# Patient Record
Sex: Female | Born: 1990 | Race: White | Hispanic: No | Marital: Single | Smoking: Current every day smoker
Health system: Southern US, Community
[De-identification: ages and names within clinical notes are randomized; demographics above are authoritative.]

## PROBLEM LIST (undated history)

## (undated) ENCOUNTER — Inpatient Hospital Stay: Payer: Self-pay

## (undated) DIAGNOSIS — F32A Depression, unspecified: Secondary | ICD-10-CM

## (undated) DIAGNOSIS — F419 Anxiety disorder, unspecified: Secondary | ICD-10-CM

## (undated) DIAGNOSIS — F329 Major depressive disorder, single episode, unspecified: Secondary | ICD-10-CM

## (undated) HISTORY — PX: TONSILLECTOMY: SUR1361

---
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
## 2012-01-26 ENCOUNTER — Ambulatory Visit: Payer: Self-pay | Admitting: Pain Medicine

## 2012-09-01 ENCOUNTER — Ambulatory Visit: Payer: Self-pay | Admitting: Neurological Surgery

## 2012-09-18 ENCOUNTER — Other Ambulatory Visit: Payer: Self-pay | Admitting: Neurological Surgery

## 2012-10-03 ENCOUNTER — Encounter (HOSPITAL_COMMUNITY): Payer: Self-pay | Admitting: Pharmacy Technician

## 2012-10-04 ENCOUNTER — Encounter (HOSPITAL_COMMUNITY)
Admission: RE | Admit: 2012-10-04 | Discharge: 2012-10-04 | Disposition: A | Discharge status: Home / Self Care | Payer: 59 | Source: Ambulatory Visit | Attending: Neurological Surgery | Admitting: Neurological Surgery

## 2012-10-04 ENCOUNTER — Encounter (HOSPITAL_COMMUNITY): Payer: Self-pay

## 2012-10-04 HISTORY — DX: Depression, unspecified: F32.A

## 2012-10-04 HISTORY — DX: Anxiety disorder, unspecified: F41.9

## 2012-10-04 HISTORY — DX: Major depressive disorder, single episode, unspecified: F32.9

## 2012-10-04 LAB — BASIC METABOLIC PANEL
Chloride: 103 mEq/L (ref 96–112)
GFR calc Af Amer: >90 mL/min (ref >90)
GFR calc non Af Amer: >90 mL/min (ref >90)
Potassium: 3.7 mEq/L (ref 3.5–5.1)
Sodium: 138 mEq/L (ref 135–145)

## 2012-10-04 LAB — CBC WITH DIFFERENTIAL/PLATELET
Basophils Absolute: 0 10*3/uL (ref 0.0–0.1)
Basophils Relative: 1 % (ref 0–1)
HCT: 41.1 % (ref 36.0–46.0)
Hemoglobin: 14.4 g/dL (ref 12.0–15.0)
Lymphocytes Relative: 30 % (ref 12–46)
Monocytes Absolute: 0.5 10*3/uL (ref 0.1–1.0)
Monocytes Relative: 8 % (ref 3–12)
Neutro Abs: 3.5 10*3/uL (ref 1.7–7.7)
Neutrophils Relative %: 57 % (ref 43–77)
WBC: 6.2 10*3/uL (ref 4.0–10.5)

## 2012-10-04 LAB — SURGICAL PCR SCREEN
MRSA, PCR: POSITIVE — AB
Staphylococcus aureus: POSITIVE — AB

## 2012-10-04 LAB — PROTIME-INR: INR: 1.01 (ref 0.00–1.49)

## 2012-10-04 LAB — HCG, SERUM, QUALITATIVE: Preg, Serum: NEGATIVE

## 2012-10-04 NOTE — Pre-Procedure Instructions (Signed)
Diana Richard  10/04/2012   Your procedure is scheduled on:  Thursday, March 27th  Report to [facility] at 0800 AM.  Call this number if you have problems the morning of surgery: [phone]   Remember:   Do not eat food or drink liquids after midnight.    Take these medicines the morning of surgery with A SIP OF WATER: Vicodin if needed   Do not wear jewelry, make-up or nail polish.  Do not wear lotions, powders, or perfumes,deodorant.  Do not shave 48 hours prior to surgery.  Do not bring valuables to the hospital.  Contacts, dentures or bridgework may not be worn into surgery.  Leave suitcase in the car. After surgery it may be brought to your room.  For patients admitted to the hospital, checkout time is 11:00 AM the day of discharge.   Patients discharged the day of surgery will not be allowed to drive home.    Special Instructions: Shower using CHG 2 nights before surgery and the night before surgery.  If you shower the day of surgery use CHG.  Use special wash - you have one bottle of CHG for all showers.  You should use approximately 1/3 of the bottle for each shower.   Please read over the following fact sheets that you were given: Pain Booklet, Coughing and Deep Breathing, MRSA Information and Surgical Site Infection Prevention

## 2012-10-04 NOTE — Progress Notes (Signed)
Does not have a primary physician No previous cardiac testing

## 2012-10-05 NOTE — Progress Notes (Signed)
Anesthesia chart review: Patient is a 22 year old female scheduled for left L4-5 microdiscectomy by Dr. Yetta Barre on 10/11/2012. History includes smoking, anxiety, depression, tonsillectomy. BMI is 23.85.  She does not have a PCP and denied previous cardiac testing.  EKG on 10/04/12 showed NSR with sinus arrhythmia, T wave abnormality, consider anterior ischemia. Currently there no comparison EKGs available. No CV symptoms documented from her PAT visit.  Preoperative labs and chest x-ray noted.  Patient is 22 years old without known cardiac, HTN, DM history.  She will be evaluated on the day of surgery.  If asymptomatic from a CV standpoint then would anticipate she could proceed as planned. Anesthesiologist Dr. Noreene Larsson agrees with this plan.  Velna Ochs [facility]/Anesthesiology Phone [phone] 10/05/2012 11:03 AM

## 2012-10-10 MED ORDER — CEFAZOLIN SODIUM-DEXTROSE 2-3 GM-% IV SOLR
2.0000 g | INTRAVENOUS | Status: AC
  Administered 2012-10-11: 2 g via INTRAVENOUS
  Filled 2012-10-10: qty 50

## 2012-10-10 NOTE — Progress Notes (Signed)
1635   Reached patient and informed her of time change.  Instructed her to be here at 0530 AM  DA

## 2012-10-11 ENCOUNTER — Encounter (HOSPITAL_COMMUNITY): Payer: Self-pay | Admitting: *Deleted

## 2012-10-11 ENCOUNTER — Ambulatory Visit (HOSPITAL_COMMUNITY)
Admission: RE | Admit: 2012-10-11 | Discharge: 2012-10-11 | Disposition: A | Discharge status: Home / Self Care | Payer: 59 | Source: Ambulatory Visit | Attending: Neurological Surgery | Admitting: Neurological Surgery

## 2012-10-11 ENCOUNTER — Ambulatory Visit (HOSPITAL_COMMUNITY): Payer: 59 | Admitting: Anesthesiology

## 2012-10-11 ENCOUNTER — Encounter (HOSPITAL_COMMUNITY): Payer: Self-pay | Admitting: Vascular Surgery

## 2012-10-11 ENCOUNTER — Encounter (HOSPITAL_COMMUNITY)
Admission: RE | Disposition: A | Discharge status: Home / Self Care | Payer: Self-pay | Source: Ambulatory Visit | Attending: Neurological Surgery

## 2012-10-11 DIAGNOSIS — Z01810 Encounter for preprocedural cardiovascular examination: Secondary | ICD-10-CM | POA: Insufficient documentation

## 2012-10-11 DIAGNOSIS — Z01818 Encounter for other preprocedural examination: Secondary | ICD-10-CM | POA: Insufficient documentation

## 2012-10-11 DIAGNOSIS — F17200 Nicotine dependence, unspecified, uncomplicated: Secondary | ICD-10-CM | POA: Insufficient documentation

## 2012-10-11 DIAGNOSIS — Z9889 Other specified postprocedural states: Secondary | ICD-10-CM

## 2012-10-11 DIAGNOSIS — M5126 Other intervertebral disc displacement, lumbar region: Secondary | ICD-10-CM | POA: Insufficient documentation

## 2012-10-11 DIAGNOSIS — Z01812 Encounter for preprocedural laboratory examination: Secondary | ICD-10-CM | POA: Insufficient documentation

## 2012-10-11 HISTORY — PX: LUMBAR LAMINECTOMY/DECOMPRESSION MICRODISCECTOMY: SHX5026

## 2012-10-11 SURGERY — LUMBAR LAMINECTOMY/DECOMPRESSION MICRODISCECTOMY 1 LEVEL
Anesthesia: General | Site: Back | Laterality: Left | Wound class: Clean

## 2012-10-11 MED ORDER — DEXAMETHASONE 4 MG PO TABS: 4.0000 mg | ORAL_TABLET | Freq: Four times a day (QID) | ORAL | Status: DC

## 2012-10-11 MED ORDER — CEFAZOLIN SODIUM 1-5 GM-% IV SOLN
1.0000 g | Freq: Three times a day (TID) | INTRAVENOUS | Status: DC
  Filled 2012-10-11: qty 50

## 2012-10-11 MED ORDER — HEMOSTATIC AGENTS (NO CHARGE) OPTIME
TOPICAL | Status: DC | PRN
  Administered 2012-10-11: 1 via TOPICAL

## 2012-10-11 MED ORDER — LIDOCAINE HCL (CARDIAC) 20 MG/ML IV SOLN
INTRAVENOUS | Status: DC | PRN
  Administered 2012-10-11: 70 mg via INTRAVENOUS

## 2012-10-11 MED ORDER — MENTHOL 3 MG MT LOZG: 1.0000 | LOZENGE | OROMUCOSAL | Status: DC | PRN

## 2012-10-11 MED ORDER — PHENOL 1.4 % MT LIQD: 1.0000 | OROMUCOSAL | Status: DC | PRN

## 2012-10-11 MED ORDER — ONDANSETRON HCL 4 MG/2ML IJ SOLN: 4.0000 mg | INTRAMUSCULAR | Status: DC | PRN

## 2012-10-11 MED ORDER — SODIUM CHLORIDE 0.9 % IJ SOLN
3.0000 mL | Freq: Two times a day (BID) | INTRAMUSCULAR | Status: DC
  Administered 2012-10-11: 3 mL via INTRAVENOUS

## 2012-10-11 MED ORDER — FENTANYL CITRATE 0.05 MG/ML IJ SOLN
INTRAMUSCULAR | Status: AC
  Filled 2012-10-11: qty 2

## 2012-10-11 MED ORDER — GLYCOPYRROLATE 0.2 MG/ML IJ SOLN
INTRAMUSCULAR | Status: DC | PRN
  Administered 2012-10-11: 0.4 mg via INTRAVENOUS

## 2012-10-11 MED ORDER — SODIUM CHLORIDE 0.9 % IV SOLN
INTRAVENOUS | Status: AC
  Filled 2012-10-11: qty 500

## 2012-10-11 MED ORDER — HYDROMORPHONE HCL PF 1 MG/ML IJ SOLN: 0.2500 mg | INTRAMUSCULAR | Status: DC | PRN

## 2012-10-11 MED ORDER — ACETAMINOPHEN 10 MG/ML IV SOLN: 1000.0000 mg | Freq: Four times a day (QID) | INTRAVENOUS | Status: DC

## 2012-10-11 MED ORDER — MIDAZOLAM HCL 5 MG/5ML IJ SOLN
INTRAMUSCULAR | Status: DC | PRN
  Administered 2012-10-11: 2 mg via INTRAVENOUS

## 2012-10-11 MED ORDER — METHYLPREDNISOLONE ACETATE 80 MG/ML IJ SUSP
INTRAMUSCULAR | Status: DC | PRN
  Administered 2012-10-11: 80 mg

## 2012-10-11 MED ORDER — PROPOFOL 10 MG/ML IV BOLUS
INTRAVENOUS | Status: DC | PRN
  Administered 2012-10-11: 170 mg via INTRAVENOUS

## 2012-10-11 MED ORDER — THROMBIN 5000 UNITS EX SOLR
CUTANEOUS | Status: DC | PRN
  Administered 2012-10-11 (×2): 5000 [IU] via TOPICAL

## 2012-10-11 MED ORDER — ACETAMINOPHEN 325 MG PO TABS: 650.0000 mg | ORAL_TABLET | ORAL | Status: DC | PRN

## 2012-10-11 MED ORDER — POTASSIUM CHLORIDE IN NACL 20-0.9 MEQ/L-% IV SOLN
INTRAVENOUS | Status: DC
  Filled 2012-10-11 (×2): qty 1000

## 2012-10-11 MED ORDER — BACITRACIN 50000 UNITS IM SOLR
INTRAMUSCULAR | Status: AC
  Filled 2012-10-11: qty 1

## 2012-10-11 MED ORDER — DEXAMETHASONE SODIUM PHOSPHATE 4 MG/ML IJ SOLN: 4.0000 mg | Freq: Four times a day (QID) | INTRAMUSCULAR | Status: DC

## 2012-10-11 MED ORDER — MORPHINE SULFATE 2 MG/ML IJ SOLN: 1.0000 mg | INTRAMUSCULAR | Status: DC | PRN

## 2012-10-11 MED ORDER — SODIUM CHLORIDE 0.9 % IJ SOLN: 3.0000 mL | INTRAMUSCULAR | Status: DC | PRN

## 2012-10-11 MED ORDER — BUPIVACAINE HCL (PF) 0.25 % IJ SOLN
INTRAMUSCULAR | Status: DC | PRN
  Administered 2012-10-11: 16 mL

## 2012-10-11 MED ORDER — ONDANSETRON HCL 4 MG/2ML IJ SOLN
INTRAMUSCULAR | Status: DC | PRN
  Administered 2012-10-11: 4 mg via INTRAVENOUS

## 2012-10-11 MED ORDER — LIDOCAINE HCL 4 % MT SOLN
OROMUCOSAL | Status: DC | PRN
  Administered 2012-10-11: 4 mL via TOPICAL

## 2012-10-11 MED ORDER — FENTANYL CITRATE 0.05 MG/ML IJ SOLN
INTRAMUSCULAR | Status: DC | PRN
  Administered 2012-10-11: 100 ug via INTRAVENOUS
  Administered 2012-10-11: 50 ug via INTRAVENOUS
  Administered 2012-10-11: 100 ug via INTRAVENOUS

## 2012-10-11 MED ORDER — CYCLOBENZAPRINE HCL 10 MG PO TABS: 10.0000 mg | ORAL_TABLET | Freq: Three times a day (TID) | ORAL | Status: DC | PRN

## 2012-10-11 MED ORDER — ROCURONIUM BROMIDE 100 MG/10ML IV SOLN
INTRAVENOUS | Status: DC | PRN
  Administered 2012-10-11: 40 mg via INTRAVENOUS

## 2012-10-11 MED ORDER — 0.9 % SODIUM CHLORIDE (POUR BTL) OPTIME
TOPICAL | Status: DC | PRN
  Administered 2012-10-11: 1000 mL

## 2012-10-11 MED ORDER — NEOSTIGMINE METHYLSULFATE 1 MG/ML IJ SOLN
INTRAMUSCULAR | Status: DC | PRN
  Administered 2012-10-11: 3 mg via INTRAVENOUS

## 2012-10-11 MED ORDER — OXYCODONE-ACETAMINOPHEN 5-325 MG PO TABS
1.0000 | ORAL_TABLET | ORAL | Status: DC | PRN
  Administered 2012-10-11: 1 via ORAL
  Filled 2012-10-11: qty 1

## 2012-10-11 MED ORDER — SODIUM CHLORIDE 0.9 % IR SOLN
Status: DC | PRN
  Administered 2012-10-11: 07:00:00

## 2012-10-11 MED ORDER — ACETAMINOPHEN 650 MG RE SUPP: 650.0000 mg | RECTAL | Status: DC | PRN

## 2012-10-11 MED ORDER — ACETAMINOPHEN 10 MG/ML IV SOLN
INTRAVENOUS | Status: AC
  Administered 2012-10-11: 1000 mg via INTRAVENOUS
  Filled 2012-10-11: qty 100

## 2012-10-11 MED ORDER — ARTIFICIAL TEARS OP OINT
TOPICAL_OINTMENT | OPHTHALMIC | Status: DC | PRN
  Administered 2012-10-11: 1 via OPHTHALMIC

## 2012-10-11 MED ORDER — LACTATED RINGERS IV SOLN
INTRAVENOUS | Status: DC | PRN
  Administered 2012-10-11 (×2): via INTRAVENOUS

## 2012-10-11 MED ORDER — DEXTROSE 5 % IV SOLN
INTRAVENOUS | Status: DC | PRN
  Administered 2012-10-11: 08:00:00 via INTRAVENOUS

## 2012-10-11 MED ORDER — DEXAMETHASONE SODIUM PHOSPHATE 10 MG/ML IJ SOLN
10.0000 mg | INTRAMUSCULAR | Status: AC
  Administered 2012-10-11: 10 mg via INTRAVENOUS
  Filled 2012-10-11: qty 1

## 2012-10-11 SURGICAL SUPPLY — 41 items
BAG DECANTER FOR FLEXI CONT (MISCELLANEOUS) ×2 IMPLANT
BENZOIN TINCTURE PRP APPL 2/3 (GAUZE/BANDAGES/DRESSINGS) ×2 IMPLANT
BUR MATCHSTICK NEURO 3.0 LAGG (BURR) ×2 IMPLANT
CANISTER SUCTION 2500CC (MISCELLANEOUS) ×2 IMPLANT
CLOTH BEACON ORANGE TIMEOUT ST (SAFETY) ×2 IMPLANT
CONT SPEC 4OZ CLIKSEAL STRL BL (MISCELLANEOUS) ×2 IMPLANT
DRAPE LAPAROTOMY 100X72X124 (DRAPES) ×2 IMPLANT
DRAPE MICROSCOPE ZEISS OPMI (DRAPES) ×2 IMPLANT
DRAPE POUCH INSTRU U-SHP 10X18 (DRAPES) ×2 IMPLANT
DRAPE SURG 17X23 STRL (DRAPES) ×2 IMPLANT
DRESSING TELFA 8X3 (GAUZE/BANDAGES/DRESSINGS) ×2 IMPLANT
DRSG OPSITE 4X5.5 SM (GAUZE/BANDAGES/DRESSINGS) ×2 IMPLANT
DURAPREP 26ML APPLICATOR (WOUND CARE) ×2 IMPLANT
ELECT REM PT RETURN 9FT ADLT (ELECTROSURGICAL) ×2
ELECTRODE REM PT RTRN 9FT ADLT (ELECTROSURGICAL) ×1 IMPLANT
GAUZE SPONGE 4X4 16PLY XRAY LF (GAUZE/BANDAGES/DRESSINGS) IMPLANT
GLOVE BIO SURGEON STRL SZ8 (GLOVE) ×2 IMPLANT
GLOVE INDICATOR 7.0 STRL GRN (GLOVE) ×2 IMPLANT
GLOVE OPTIFIT SS 6.5 STRL BRWN (GLOVE) ×4 IMPLANT
GOWN BRE IMP SLV AUR LG STRL (GOWN DISPOSABLE) IMPLANT
GOWN BRE IMP SLV AUR XL STRL (GOWN DISPOSABLE) ×4 IMPLANT
GOWN STRL REIN 2XL LVL4 (GOWN DISPOSABLE) IMPLANT
HEMOSTAT POWDER KIT SURGIFOAM (HEMOSTASIS) IMPLANT
KIT BASIN OR (CUSTOM PROCEDURE TRAY) ×2 IMPLANT
KIT ROOM TURNOVER OR (KITS) ×2 IMPLANT
NEEDLE HYPO 25X1 1.5 SAFETY (NEEDLE) ×2 IMPLANT
NEEDLE SPNL 20GX3.5 QUINCKE YW (NEEDLE) IMPLANT
NS IRRIG 1000ML POUR BTL (IV SOLUTION) ×2 IMPLANT
PACK LAMINECTOMY NEURO (CUSTOM PROCEDURE TRAY) ×2 IMPLANT
PAD ARMBOARD 7.5X6 YLW CONV (MISCELLANEOUS) ×6 IMPLANT
RUBBERBAND STERILE (MISCELLANEOUS) ×4 IMPLANT
SPONGE SURGIFOAM ABS GEL SZ50 (HEMOSTASIS) ×2 IMPLANT
STRIP CLOSURE SKIN 1/2X4 (GAUZE/BANDAGES/DRESSINGS) ×2 IMPLANT
SUT VIC AB 0 CT1 18XCR BRD8 (SUTURE) ×1 IMPLANT
SUT VIC AB 0 CT1 8-18 (SUTURE) ×1
SUT VIC AB 2-0 CP2 18 (SUTURE) ×2 IMPLANT
SUT VIC AB 3-0 SH 8-18 (SUTURE) ×2 IMPLANT
SYR 20ML ECCENTRIC (SYRINGE) ×2 IMPLANT
TOWEL OR 17X24 6PK STRL BLUE (TOWEL DISPOSABLE) ×2 IMPLANT
TOWEL OR 17X26 10 PK STRL BLUE (TOWEL DISPOSABLE) ×2 IMPLANT
WATER STERILE IRR 1000ML POUR (IV SOLUTION) ×2 IMPLANT

## 2012-10-11 NOTE — Anesthesia Postprocedure Evaluation (Signed)
  Anesthesia Post-op Note  Patient: Diana Richard  Procedure(s) Performed: Procedure(s) with comments: LUMBAR LAMINECTOMY/DECOMPRESSION MICRODISCECTOMY 1 LEVEL (Left) - left lumbar four-five  Patient Location: PACU  Anesthesia Type:General  Level of Consciousness: awake  Airway and Oxygen Therapy: Patient Spontanous Breathing  Post-op Pain: mild  Post-op Assessment: Post-op Vital signs reviewed  Post-op Vital Signs: Reviewed  Complications: No apparent anesthesia complications

## 2012-10-11 NOTE — Anesthesia Procedure Notes (Signed)
Procedure Name: Intubation Date/Time: 10/11/2012 7:42 AM Performed by: Tyrone Nine Pre-anesthesia Checklist: Patient identified, Timeout performed, Emergency Drugs available, Suction available and Patient being monitored Patient Re-evaluated:Patient Re-evaluated prior to inductionOxygen Delivery Method: Circle system utilized Preoxygenation: Pre-oxygenation with 100% oxygen Intubation Type: IV induction Ventilation: Mask ventilation without difficulty Laryngoscope Size: Mac and 3 Grade View: Grade I Tube type: Oral Tube size: 7.0 mm Number of attempts: 1 Airway Equipment and Method: Stylet Placement Confirmation: ETT inserted through vocal cords under direct vision,  breath sounds checked- equal and bilateral and positive ETCO2 Secured at: 22 cm Tube secured with: Tape

## 2012-10-11 NOTE — H&P (Signed)
Subjective: Patient is a 22 y.o. female admitted for L L4-5 microdiskectomy. Onset of symptoms was several months ago, gradually worsening since that time.  The pain is rated severe, and is located at the across the lower back and radiates to LLE. The pain is described as aching and occurs intermittently. The symptoms have been progressive. Symptoms are exacerbated by exercise. 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   Past Medical History  Diagnosis Date  . Depression   . Anxiety     Past Surgical History  Procedure Laterality Date  . Tonsillectomy      Prior to Admission medications   Medication Sig Start Date End Date Taking? Authorizing Provider  HYDROcodone-acetaminophen (NORCO/VICODIN) 5-325 MG per tablet Take 2 tablets by mouth every 6 (six) hours as needed for pain.   Yes Historical Provider, MD   No Known Allergies  History  Substance Use Topics  . Smoking status: Current Every Day Smoker -- 1.00 packs/day for 6 years    Types: Cigarettes  . Smokeless tobacco: Not on file  . Alcohol Use: No    History reviewed. No pertinent family history.   Review of Systems  Positive ROS: neg  All other systems have been reviewed and were otherwise negative with the exception of those mentioned in the HPI and as above.  Objective: Vital signs in last 24 hours: Temp:  [97.8 F (36.6 C)] 97.8 F (36.6 C) (03/27 0624) Pulse Rate:  [66] 66 (03/27 0624) Resp:  [20] 20 (03/27 0624) BP: (119)/(78) 119/78 mmHg (03/27 0624) SpO2:  [98 %] 98 % (03/27 0624)  General Appearance: Alert, cooperative, no distress, appears stated age Head: Normocephalic, without obvious abnormality, atraumatic Eyes: PERRL, conjunctiva/corneas clear, EOM's intact, fundi benign, both eyes      Ears: Normal TM's and external ear canals, both ears Throat: Lips, mucosa, and tongue normal; teeth and gums normal Neck: Supple, symmetrical, trachea midline, no adenopathy; thyroid: No enlargement/tenderness/nodules; no carotid  bruit or JVD Back: Symmetric, no curvature, ROM normal, no CVA tenderness Lungs: Clear to auscultation bilaterally, respirations unlabored Heart: Regular rate and rhythm, S1 and S2 normal, no murmur, rub or gallop Abdomen: Soft, non-tender, bowel sounds active all four quadrants, no masses, no organomegaly Extremities: Extremities normal, atraumatic, no cyanosis or edema Pulses: 2+ and symmetric all extremities Skin: Skin color, texture, turgor normal, no rashes or lesions  NEUROLOGIC:   Mental status: Alert and oriented x4,  no aphasia, good attention span, fund of knowledge, and memory Motor Exam - grossly normal Sensory Exam - grossly normal Reflexes: 1+ Coordination - grossly normal Gait - grossly normal Balance - grossly normal Cranial Nerves: I: smell Not tested  II: visual acuity  OS: nl    OD: nl  II: visual fields Full to confrontation  II: pupils Equal, round, reactive to light  III,VII: ptosis None  III,IV,VI: extraocular muscles  Full ROM  V: mastication Normal  V: facial light touch sensation  Normal  V,VII: corneal reflex  Present  VII: facial muscle function - upper  Normal  VII: facial muscle function - lower Normal  VIII: hearing Not tested  IX: soft palate elevation  Normal  IX,X: gag reflex Present  XI: trapezius strength  5/5  XI: sternocleidomastoid strength 5/5  XI: neck flexion strength  5/5  XII: tongue strength  Normal    Data Review Lab Results  Component Value Date   WBC 6.2 10/04/2012   HGB 14.4 10/04/2012   HCT 41.1 10/04/2012   MCV  90.9 10/04/2012   PLT 231 10/04/2012   Lab Results  Component Value Date   NA 138 10/04/2012   K 3.7 10/04/2012   CL 103 10/04/2012   CO2 22 10/04/2012   BUN 9 10/04/2012   CREATININE 0.68 10/04/2012   GLUCOSE 100* 10/04/2012   Lab Results  Component Value Date   INR 1.01 10/04/2012    Assessment/Plan: Patient admitted for microdiskectomy L4-5 Left. Patient has failed conservative therapy.  I explained the  condition and procedure to the patient and answered any questions.  Patient wishes to proceed with procedure as planned. Understands risks/ benefits and typical outcomes of procedure.   Kellyann Ordway S 10/11/2012 7:25 AM

## 2012-10-11 NOTE — Plan of Care (Signed)
Problem: Consults Goal: Diagnosis - Spinal Surgery Outcome: Completed/Met Date Met:  10/11/12 Lumbar Laminectomy (Complex)

## 2012-10-11 NOTE — Discharge Summary (Signed)
Physician Discharge Summary  Patient ID: Diana Richard MRN: [id number] DOB/AGE: [birth year] 22 y.o.  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 The patient tolerated the procedure well and was taken to the recovery room and then to the floor in stable condition. The hospital course was routine. There were no complications. The wound remained clean dry and intact. Pt had appropriate back soreness. No complaints of leg pain or new N/T/W. The patient remained afebrile with stable vital signs, and tolerated a regular diet. The patient continued to increase activities, and pain was well controlled with oral pain medications.   Consults: None  Significant Diagnostic Studies:  Results for orders placed during the hospital encounter of 10/04/12  SURGICAL PCR SCREEN      Result Value Range   MRSA, PCR POSITIVE (*) NEGATIVE   Staphylococcus aureus POSITIVE (*) NEGATIVE  HCG, SERUM, QUALITATIVE      Result Value Range   Preg, Serum NEGATIVE  NEGATIVE  BASIC METABOLIC PANEL      Result Value Range   Sodium 138  135 - 145 mEq/L   Potassium 3.7  3.5 - 5.1 mEq/L   Chloride 103  96 - 112 mEq/L   CO2 22  19 - 32 mEq/L   Glucose, Bld 100 (*) 70 - 99 mg/dL   BUN 9  6 - 23 mg/dL   Creatinine, Ser 7.84  0.50 - 1.10 mg/dL   Calcium 9.6  8.4 - 69.6 mg/dL   GFR calc non Af Amer >90  >90 mL/min   GFR calc Af Amer >90  >90 mL/min  CBC WITH DIFFERENTIAL      Result Value Range   WBC 6.2  4.0 - 10.5 K/uL   RBC 4.52  3.87 - 5.11 MIL/uL   Hemoglobin 14.4  12.0 - 15.0 g/dL   HCT 29.5  28.4 - 13.2 %   MCV 90.9  78.0 - 100.0 fL   MCH 31.9  26.0 - 34.0 pg   MCHC 35.0  30.0 - 36.0 g/dL   RDW 44.0  10.2 - 72.5 %   Platelets 231  150 - 400 K/uL   Neutrophils  Relative 57  43 - 77 %   Neutro Abs 3.5  1.7 - 7.7 K/uL   Lymphocytes Relative 30  12 - 46 %   Lymphs Abs 1.9  0.7 - 4.0 K/uL   Monocytes Relative 8  3 - 12 %   Monocytes Absolute 0.5  0.1 - 1.0 K/uL   Eosinophils Relative 5  0 - 5 %   Eosinophils Absolute 0.3  0.0 - 0.7 K/uL   Basophils Relative 1  0 - 1 %   Basophils Absolute 0.0  0.0 - 0.1 K/uL  PROTIME-INR      Result Value Range   Prothrombin Time 13.2  11.6 - 15.2 seconds   INR 1.01  0.00 - 1.49    Chest 2 View  10/04/2012  *RADIOLOGY REPORT*  Clinical Data: Preoperative lumbar spine surgery  CHEST - 2 VIEW  Comparison: None.  Findings:  Lungs clear.  Heart size and pulmonary vascularity are normal.  No adenopathy.  No bone lesions.  There is mild thoracolumbar levoscoliosis.  IMPRESSION: No edema or consolidation.   Original Report Authenticated By: Bretta Bang, M.D.  Antibiotics:  Anti-infectives   Start     Dose/Rate Route Frequency Ordered Stop   10/11/12 1545  ceFAZolin (ANCEF) IVPB 1 g/50 mL premix  Status:  Discontinued     1 g 100 mL/hr over 30 Minutes Intravenous Every 8 hours 10/11/12 1022 10/11/12 1602   10/11/12 0720  bacitracin 50,000 Units in sodium chloride irrigation 0.9 % 500 mL irrigation  Status:  Discontinued       As needed 10/11/12 0827 10/11/12 0906   10/11/12 0702  bacitracin 78469 UNITS injection  Status:  Discontinued    Comments:  RATCLIFF, ESTHER: cabinet override      10/11/12 0702 10/11/12 1602   10/11/12 0600  ceFAZolin (ANCEF) IVPB 2 g/50 mL premix     2 g 100 mL/hr over 30 Minutes Intravenous On call to O.R. 10/10/12 1449 10/11/12 0745      Discharge Exam: Blood pressure 111/70, pulse 74, temperature 98.7 F (37.1 C), temperature source Oral, resp. rate 16, last menstrual period 10/08/2012, SpO2 97.00%. Neurologic: Grossly normal incision CDI  Discharge Medications:     Medication List    TAKE these medications       HYDROcodone-acetaminophen 5-325 MG per tablet   Commonly known as:  NORCO/VICODIN  Take 2 tablets by mouth every 6 (six) hours as needed for pain.        Disposition: home   Final Dx: L L4-5 microdiskectomy      Discharge Orders   Future Orders Complete By Expires     Call MD for:  difficulty breathing, headache or visual disturbances  As directed     Call MD for:  persistant nausea and vomiting  As directed     Call MD for:  redness, tenderness, or signs of infection (pain, swelling, redness, odor or green/yellow discharge around incision site)  As directed     Call MD for:  severe uncontrolled pain  As directed     Call MD for:  temperature >100.4  As directed     Diet - low sodium heart healthy  As directed     Discharge instructions  As directed     Comments:      No driving, bending, or twisting. No lifting more than 10 lbs. May shower normally.    Increase activity slowly  As directed     Remove dressing in 48 hours  As directed        Follow-up Information   Follow up with Caci Orren S, MD. Schedule an appointment as soon as possible for a visit in 2 weeks.   Contact information:   [address] [place] [address] [phone]        Signed: Tia Alert 10/11/2012, 4:53 PM

## 2012-10-11 NOTE — OR Nursing (Signed)
Fentanyl 50 mcg/ml 2 ml  given to operative site by Dr Yetta Barre

## 2012-10-11 NOTE — Progress Notes (Signed)
Pt. Alert and oriented,follows simple instructions, denies pain. Incision area without swelling, redness or S/S of infection. Voiding adequate clear yellow urine. Moving all extremities well and vitals stable and documented. Lumbar surgery notes instructions given to patient and family member for home safety and precautions. Pt. and family stated understanding of instructions given.  

## 2012-10-11 NOTE — Preoperative (Signed)
Beta Blockers   Reason not to administer Beta Blockers:Not Applicable 

## 2012-10-11 NOTE — Transfer of Care (Signed)
Immediate Anesthesia Transfer of Care Note  Patient: Diana Richard  Procedure(s) Performed: Procedure(s) with comments: LUMBAR LAMINECTOMY/DECOMPRESSION MICRODISCECTOMY 1 LEVEL (Left) - left lumbar four-five  Patient Location: PACU  Anesthesia Type:General  Level of Consciousness: awake, alert , oriented and patient cooperative  Airway & Oxygen Therapy: Patient Spontanous Breathing and Patient connected to nasal cannula oxygen  Post-op Assessment: Report given to PACU RN and Post -op Vital signs reviewed and stable  Post vital signs: Reviewed and stable  Complications: No apparent anesthesia complications

## 2012-10-11 NOTE — Anesthesia Preprocedure Evaluation (Addendum)
Anesthesia Evaluation  Patient identified by MRN, date of birth, ID band Patient awake    Reviewed: Allergy & Precautions, H&P , NPO status   History of Anesthesia Complications (+) PONV  Airway Mallampati: II TM Distance: >3 FB Neck ROM: Full    Dental  (+) Teeth Intact and Dental Advisory Given   Pulmonary Current Smoker,  Chest X-ray 10-04-12  Findings:  Lungs clear.  Heart size and pulmonary vascularity are normal.  No adenopathy.  No bone lesions.  There is mild thoracolumbar levoscoliosis.   IMPRESSION: No edema or consolidation.    breath sounds clear to auscultation  Pulmonary exam normal       Cardiovascular Exercise Tolerance: Good negative cardio ROS  Rhythm:Regular Rate:Normal     Neuro/Psych PSYCHIATRIC DISORDERS Anxiety Anxiousnegative neurological ROS     GI/Hepatic negative GI ROS, Neg liver ROS,   Endo/Other    Renal/GU negative Renal ROS  negative genitourinary   Musculoskeletal   Abdominal   Peds  Hematology negative hematology ROS (+)   Anesthesia Other Findings Left Leg pain  Reproductive/Obstetrics                      Anesthesia Physical Anesthesia Plan  ASA: II  Anesthesia Plan: General LMA   Post-op Pain Management:    Induction: Intravenous  Airway Management Planned: Oral ETT  Additional Equipment:   Intra-op Plan:   Post-operative Plan: Extubation in OR  Informed Consent:   Dental Advisory Given  Plan Discussed with: CRNA, Anesthesiologist and Surgeon  Anesthesia Plan Comments:        Anesthesia Quick Evaluation

## 2012-10-11 NOTE — Op Note (Signed)
10/11/2012  9:09 AM  PATIENT:  Diana Richard  22 y.o. 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 Gerlene Fee  ANESTHESIA:   General  EBL: 20 ml  Total I/O In: 1150 [I.V.:1150] Out: 20 [Blood:20]  BLOOD ADMINISTERED:none  DRAINS: none   SPECIMEN:  No Specimen  INDICATION FOR PROCEDURE: This patient presented with a history of severe back and left leg pain. She tried medical management for many months without relief. 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 I recommended a left L4-5 microdiscectomy. Patient understood the risks, benefits, and alternatives and potential outcomes and wished to proceed.  PROCEDURE DETAILS: The patient was taken to the operating room and after induction of adequate generalized endotracheal anesthesia, the patient was rolled into the prone position on the Wilson frame and all pressure points were padded. The lumbar region was cleaned and then prepped with DuraPrep and draped in the usual sterile fashion. 5 cc of local anesthesia was injected and then a dorsal midline incision was made and carried down to the lumbo sacral fascia. The fascia was opened and the paraspinous musculature was taken down in a subperiosteal fashion to expose L4-5  on the left. Intraoperative x-ray confirmed my level, and then I used a combination of the high-speed drill and the Kerrison punches to perform a hemilaminectomy, medial facetectomy, and foraminotomy at L4-5 on the left. The underlying yellow ligament was opened and removed in a piecemeal fashion to expose the underlying dura and exiting nerve root. I undercut the lateral recess and dissected down until I was  medial to and distal to the pedicle. The nerve root was well decompressed. We then gently retracted the nerve root medially with a retractor, coagulated the epidural venous vasculature, and found a large subannular disc protrusion compressing the left L5 nerve root. I incised the disc space and started to release the disc with I nerve hook. A performed a thorough intradiscal discectomy with pituitary rongeurs and curettes, until I had a nice decompression of the nerve root and the midline. I then palpated with a coronary dilator along the nerve root and into the foramen to assure adequate decompression. I felt no more compression of the nerve root. I irrigated with saline solution containing bacitracin. Achieved hemostasis with bipolar cautery, lined the dura with Gelfoam, and then closed the fascia with 0 Vicryl. I closed the subcutaneous tissues with 2-0 Vicryl and the subcuticular tissues with 3-0 Vicryl. The skin was then closed with benzoin and Steri-Strips. The drapes were removed, a sterile dressing was applied. The patient was awakened from general anesthesia and transferred to the recovery room in stable condition. At the end of the procedure all sponge, needle and instrument counts were correct.   PLAN OF CARE: Admit for overnight observation  PATIENT DISPOSITION:  PACU - hemodynamically stable.   Delay start of Pharmacological VTE agent (>24hrs) due to surgical blood loss or risk of bleeding:  yes

## 2012-10-12 ENCOUNTER — Encounter (HOSPITAL_COMMUNITY): Payer: Self-pay | Admitting: Neurological Surgery

## 2013-02-16 ENCOUNTER — Ambulatory Visit: Payer: Self-pay | Admitting: Neurological Surgery

## 2013-04-29 ENCOUNTER — Other Ambulatory Visit: Payer: Self-pay | Admitting: Neurological Surgery

## 2013-05-27 ENCOUNTER — Encounter (HOSPITAL_COMMUNITY): Payer: Self-pay | Admitting: Pharmacy Technician

## 2013-05-31 ENCOUNTER — Encounter (HOSPITAL_COMMUNITY): Payer: Self-pay

## 2013-05-31 ENCOUNTER — Inpatient Hospital Stay (HOSPITAL_COMMUNITY)
Admission: RE | Admit: 2013-05-31 | Discharge: 2013-05-31 | Disposition: A | Discharge status: Home / Self Care | Payer: 59 | Source: Ambulatory Visit

## 2013-05-31 ENCOUNTER — Other Ambulatory Visit (HOSPITAL_COMMUNITY): Payer: Self-pay | Admitting: *Deleted

## 2013-05-31 NOTE — Progress Notes (Signed)
Patient unable to come for preadmit due to car trouble. Hx done over phone

## 2013-05-31 NOTE — Pre-Procedure Instructions (Signed)
Diana Richard  05/31/2013   Your procedure is scheduled on:  06/03/13  Report to [facility] short stay admitting at 530 AM.  Call this number if you have problems the morning of surgery: [phone]   Remember:   Do not eat food or drink liquids after midnight.   Take these medicines the morning of surgery with A SIP OF WATER: metoprolol, gabapentin, amlodipine, clonidine, omeprazole, sodium bicarb              STOP all herbel meds, nsaids (aleve,naproxen,advil,ibuprofen) 5 days prior to surgery   Do not wear jewelry, make-up or nail polish.  Do not wear lotions, powders, or perfumes. You may wear deodorant.  Do not shave 48 hours prior to surgery. Men may shave face and neck.  Do not bring valuables to the hospital.  [facility] is not responsible                  for any belongings or valuables.               Contacts, dentures or bridgework may not be worn into surgery.  Leave suitcase in the car. After surgery it may be brought to your room.  For patients admitted to the hospital, discharge time is determined by your                treatment team.               Patients discharged the day of surgery will not be allowed to drive  home.  Name and phone number of your driver:   Special Instructions: Shower using CHG 2 nights before surgery and the night before surgery.  If you shower the day of surgery use CHG.  Use special wash - you have one bottle of CHG for all showers.  You should use approximately 1/3 of the bottle for each shower.   Please read over the following fact sheets that you were given: Pain Booklet, Coughing and Deep Breathing, MRSA Information and Surgical Site Infection Prevention

## 2013-06-06 ENCOUNTER — Encounter (HOSPITAL_COMMUNITY)
Admission: RE | Admit: 2013-06-06 | Discharge: 2013-06-06 | Disposition: A | Discharge status: Home / Self Care | Payer: 59 | Source: Ambulatory Visit | Attending: Neurological Surgery | Admitting: Neurological Surgery

## 2013-06-06 ENCOUNTER — Encounter (HOSPITAL_COMMUNITY): Payer: Self-pay

## 2013-06-06 LAB — PROTIME-INR
INR: 0.93 (ref 0.00–1.49)
Prothrombin Time: 12.3 seconds (ref 11.6–15.2)

## 2013-06-06 LAB — CBC WITH DIFFERENTIAL/PLATELET
Hemoglobin: 15.1 g/dL — ABNORMAL HIGH (ref 12.0–15.0)
Lymphocytes Relative: 31 % (ref 12–46)
Lymphs Abs: 2 10*3/uL (ref 0.7–4.0)
Neutrophils Relative %: 53 % (ref 43–77)
Platelets: 274 10*3/uL (ref 150–400)
RBC: 4.76 MIL/uL (ref 3.87–5.11)
WBC: 6.4 10*3/uL (ref 4.0–10.5)

## 2013-06-06 LAB — BASIC METABOLIC PANEL
CO2: 21 mEq/L (ref 19–32)
Calcium: 9.7 mg/dL (ref 8.4–10.5)
GFR calc non Af Amer: >90 mL/min (ref >90)
Potassium: 3.7 mEq/L (ref 3.5–5.1)
Sodium: 138 mEq/L (ref 135–145)

## 2013-06-06 LAB — HCG, SERUM, QUALITATIVE: Preg, Serum: NEGATIVE

## 2013-06-06 MED ORDER — CEFAZOLIN SODIUM-DEXTROSE 2-3 GM-% IV SOLR
2.0000 g | INTRAVENOUS | Status: AC
  Administered 2013-06-07: 2 g via INTRAVENOUS
  Filled 2013-06-06: qty 50

## 2013-06-06 NOTE — Pre-Procedure Instructions (Signed)
Diana Richard  06/06/2013   Your procedure is scheduled on:  Friday November 21 th at 0730 AM  Report to [facility] Main  Entrance "A" at 0530 AM.  Call this number if you have problems the morning of surgery: [phone]   Remember:   Do not eat food or drink liquids after midnight.   Take these medicines the morning of surgery with A SIP OF WATER: Percocet if needed for pain   Do not wear jewelry, make-up or nail polish.  Do not wear lotions, powders, or perfumes. You may wear deodorant.  Do not shave 48 hours prior to surgery.   Do not bring valuables to the hospital.  [facility] is not responsible for any belongings or valuables.               Contacts, dentures or bridgework may not be worn into surgery.  Leave suitcase in the car. After surgery it may be brought to your room.  For patients admitted to the hospital, discharge time is determined by your  treatment team.               Patients discharged the day of surgery will not be allowed to drive home.    Special Instructions: Shower using CHG 2 nights before surgery and the night before surgery.  If you shower the day of surgery use CHG.  Use special wash - you have one bottle of CHG for all showers.  You should use approximately 1/3 of the bottle for each shower.   Please read over the following fact sheets that you were given: Pain Booklet, Coughing and Deep Breathing, MRSA Information and Surgical Site Infection Prevention

## 2013-06-07 ENCOUNTER — Encounter (HOSPITAL_COMMUNITY): Payer: Self-pay | Admitting: *Deleted

## 2013-06-07 ENCOUNTER — Ambulatory Visit (HOSPITAL_COMMUNITY): Payer: 59 | Admitting: Certified Registered"

## 2013-06-07 ENCOUNTER — Ambulatory Visit (HOSPITAL_COMMUNITY)
Admission: RE | Admit: 2013-06-07 | Discharge: 2013-06-07 | Disposition: A | Discharge status: Home / Self Care | Payer: 59 | Source: Ambulatory Visit | Attending: Neurological Surgery | Admitting: Neurological Surgery

## 2013-06-07 ENCOUNTER — Encounter (HOSPITAL_COMMUNITY): Payer: 59 | Admitting: Vascular Surgery

## 2013-06-07 ENCOUNTER — Ambulatory Visit (HOSPITAL_COMMUNITY): Payer: 59

## 2013-06-07 ENCOUNTER — Encounter (HOSPITAL_COMMUNITY)
Admission: RE | Disposition: A | Discharge status: Home / Self Care | Payer: Self-pay | Source: Ambulatory Visit | Attending: Neurological Surgery

## 2013-06-07 DIAGNOSIS — F329 Major depressive disorder, single episode, unspecified: Secondary | ICD-10-CM | POA: Insufficient documentation

## 2013-06-07 DIAGNOSIS — M5126 Other intervertebral disc displacement, lumbar region: Secondary | ICD-10-CM | POA: Insufficient documentation

## 2013-06-07 DIAGNOSIS — F3289 Other specified depressive episodes: Secondary | ICD-10-CM | POA: Insufficient documentation

## 2013-06-07 DIAGNOSIS — F17200 Nicotine dependence, unspecified, uncomplicated: Secondary | ICD-10-CM | POA: Insufficient documentation

## 2013-06-07 DIAGNOSIS — F411 Generalized anxiety disorder: Secondary | ICD-10-CM | POA: Insufficient documentation

## 2013-06-07 HISTORY — PX: LUMBAR LAMINECTOMY/DECOMPRESSION MICRODISCECTOMY: SHX5026

## 2013-06-07 SURGERY — LUMBAR LAMINECTOMY/DECOMPRESSION MICRODISCECTOMY 1 LEVEL
Anesthesia: General | Site: Back | Laterality: Right | Wound class: Clean

## 2013-06-07 MED ORDER — DEXAMETHASONE SODIUM PHOSPHATE 4 MG/ML IJ SOLN
4.0000 mg | Freq: Four times a day (QID) | INTRAMUSCULAR | Status: DC
  Administered 2013-06-07: 4 mg via INTRAVENOUS
  Filled 2013-06-07: qty 1

## 2013-06-07 MED ORDER — ONDANSETRON HCL 4 MG/2ML IJ SOLN: 4.0000 mg | INTRAMUSCULAR | Status: DC | PRN

## 2013-06-07 MED ORDER — METHOCARBAMOL 500 MG PO TABS
500.0000 mg | ORAL_TABLET | Freq: Four times a day (QID) | ORAL | Status: DC | PRN
  Administered 2013-06-07: 500 mg via ORAL
  Filled 2013-06-07: qty 1

## 2013-06-07 MED ORDER — LACTATED RINGERS IV SOLN
INTRAVENOUS | Status: DC | PRN
  Administered 2013-06-07 (×2): via INTRAVENOUS

## 2013-06-07 MED ORDER — NEOSTIGMINE METHYLSULFATE 1 MG/ML IJ SOLN
INTRAMUSCULAR | Status: DC | PRN
  Administered 2013-06-07: 3 mg via INTRAVENOUS

## 2013-06-07 MED ORDER — ACETAMINOPHEN 650 MG RE SUPP: 650.0000 mg | RECTAL | Status: DC | PRN

## 2013-06-07 MED ORDER — SODIUM CHLORIDE 0.9 % IJ SOLN
3.0000 mL | Freq: Two times a day (BID) | INTRAMUSCULAR | Status: DC
  Administered 2013-06-07: 3 mL via INTRAVENOUS

## 2013-06-07 MED ORDER — MORPHINE SULFATE 2 MG/ML IJ SOLN
1.0000 mg | INTRAMUSCULAR | Status: DC | PRN
  Administered 2013-06-07: 2 mg via INTRAVENOUS
  Filled 2013-06-07: qty 1

## 2013-06-07 MED ORDER — GLYCOPYRROLATE 0.2 MG/ML IJ SOLN
INTRAMUSCULAR | Status: DC | PRN
  Administered 2013-06-07: 0.4 mg via INTRAVENOUS

## 2013-06-07 MED ORDER — POTASSIUM CHLORIDE IN NACL 20-0.9 MEQ/L-% IV SOLN
INTRAVENOUS | Status: DC
  Filled 2013-06-07: qty 1000

## 2013-06-07 MED ORDER — THROMBIN 5000 UNITS EX SOLR
OROMUCOSAL | Status: DC | PRN
  Administered 2013-06-07: 08:00:00 via TOPICAL

## 2013-06-07 MED ORDER — MUPIROCIN CALCIUM 2 % EX CREA
TOPICAL_CREAM | Freq: Two times a day (BID) | CUTANEOUS | Status: DC
  Filled 2013-06-07: qty 15

## 2013-06-07 MED ORDER — LIDOCAINE HCL (CARDIAC) 20 MG/ML IV SOLN
INTRAVENOUS | Status: DC | PRN
  Administered 2013-06-07: 80 mg via INTRAVENOUS

## 2013-06-07 MED ORDER — DEXAMETHASONE 4 MG PO TABS: 4.0000 mg | ORAL_TABLET | Freq: Four times a day (QID) | ORAL | Status: DC

## 2013-06-07 MED ORDER — FENTANYL CITRATE 0.05 MG/ML IJ SOLN
INTRAMUSCULAR | Status: DC | PRN
  Administered 2013-06-07: 100 ug via INTRAVENOUS

## 2013-06-07 MED ORDER — OXYCODONE-ACETAMINOPHEN 5-325 MG PO TABS
1.0000 | ORAL_TABLET | ORAL | Status: DC | PRN
  Administered 2013-06-07: 2 via ORAL
  Filled 2013-06-07: qty 2

## 2013-06-07 MED ORDER — SODIUM CHLORIDE 0.9 % IJ SOLN: 3.0000 mL | INTRAMUSCULAR | Status: DC | PRN

## 2013-06-07 MED ORDER — HYDROMORPHONE HCL PF 1 MG/ML IJ SOLN: 0.2500 mg | INTRAMUSCULAR | Status: DC | PRN

## 2013-06-07 MED ORDER — METHOCARBAMOL 100 MG/ML IJ SOLN
500.0000 mg | Freq: Four times a day (QID) | INTRAVENOUS | Status: DC | PRN
  Filled 2013-06-07: qty 5

## 2013-06-07 MED ORDER — ROCURONIUM BROMIDE 100 MG/10ML IV SOLN
INTRAVENOUS | Status: DC | PRN
  Administered 2013-06-07: 35 mg via INTRAVENOUS

## 2013-06-07 MED ORDER — PHENOL 1.4 % MT LIQD: 1.0000 | OROMUCOSAL | Status: DC | PRN

## 2013-06-07 MED ORDER — ACETAMINOPHEN 325 MG PO TABS: 650.0000 mg | ORAL_TABLET | ORAL | Status: DC | PRN

## 2013-06-07 MED ORDER — THROMBIN 5000 UNITS EX SOLR
CUTANEOUS | Status: DC | PRN
  Administered 2013-06-07 (×2): 5000 [IU] via TOPICAL

## 2013-06-07 MED ORDER — ONDANSETRON HCL 4 MG/2ML IJ SOLN: 4.0000 mg | Freq: Once | INTRAMUSCULAR | Status: DC | PRN

## 2013-06-07 MED ORDER — 0.9 % SODIUM CHLORIDE (POUR BTL) OPTIME
TOPICAL | Status: DC | PRN
  Administered 2013-06-07: 1000 mL

## 2013-06-07 MED ORDER — CEFAZOLIN SODIUM 1-5 GM-% IV SOLN
1.0000 g | Freq: Three times a day (TID) | INTRAVENOUS | Status: DC
  Filled 2013-06-07: qty 50

## 2013-06-07 MED ORDER — SODIUM CHLORIDE 0.9 % IR SOLN
Status: DC | PRN
  Administered 2013-06-07: 08:00:00

## 2013-06-07 MED ORDER — ONDANSETRON HCL 4 MG/2ML IJ SOLN
INTRAMUSCULAR | Status: DC | PRN
  Administered 2013-06-07: 4 mg via INTRAVENOUS

## 2013-06-07 MED ORDER — MENTHOL 3 MG MT LOZG: 1.0000 | LOZENGE | OROMUCOSAL | Status: DC | PRN

## 2013-06-07 MED ORDER — LIDOCAINE HCL 4 % MT SOLN
OROMUCOSAL | Status: DC | PRN
  Administered 2013-06-07: 5 mL via TOPICAL

## 2013-06-07 MED ORDER — MUPIROCIN 2 % EX OINT
TOPICAL_OINTMENT | CUTANEOUS | Status: AC
  Administered 2013-06-07: 1
  Filled 2013-06-07: qty 22

## 2013-06-07 MED ORDER — KETOROLAC TROMETHAMINE 30 MG/ML IJ SOLN
INTRAMUSCULAR | Status: DC | PRN
  Administered 2013-06-07: 30 mg via INTRAVENOUS

## 2013-06-07 MED ORDER — HEMOSTATIC AGENTS (NO CHARGE) OPTIME
TOPICAL | Status: DC | PRN
  Administered 2013-06-07: 1 via TOPICAL

## 2013-06-07 MED ORDER — MIDAZOLAM HCL 5 MG/5ML IJ SOLN
INTRAMUSCULAR | Status: DC | PRN
  Administered 2013-06-07: 2 mg via INTRAVENOUS

## 2013-06-07 MED ORDER — PROPOFOL 10 MG/ML IV BOLUS
INTRAVENOUS | Status: DC | PRN
  Administered 2013-06-07: 200 mg via INTRAVENOUS

## 2013-06-07 MED ORDER — BUPIVACAINE HCL (PF) 0.25 % IJ SOLN
INTRAMUSCULAR | Status: DC | PRN
  Administered 2013-06-07: 13 mL

## 2013-06-07 MED ORDER — DEXAMETHASONE SODIUM PHOSPHATE 10 MG/ML IJ SOLN
10.0000 mg | INTRAMUSCULAR | Status: AC
  Administered 2013-06-07: 10 mg via INTRAVENOUS
  Filled 2013-06-07: qty 1

## 2013-06-07 MED ORDER — ARTIFICIAL TEARS OP OINT
TOPICAL_OINTMENT | OPHTHALMIC | Status: DC | PRN
  Administered 2013-06-07: 1 via OPHTHALMIC

## 2013-06-07 SURGICAL SUPPLY — 44 items
BAG DECANTER FOR FLEXI CONT (MISCELLANEOUS) ×2 IMPLANT
BENZOIN TINCTURE PRP APPL 2/3 (GAUZE/BANDAGES/DRESSINGS) ×2 IMPLANT
BUR MATCHSTICK NEURO 3.0 LAGG (BURR) ×2 IMPLANT
CANISTER SUCT 3000ML (MISCELLANEOUS) ×2 IMPLANT
CONT SPEC 4OZ CLIKSEAL STRL BL (MISCELLANEOUS) ×2 IMPLANT
DRAPE LAPAROTOMY 100X72X124 (DRAPES) ×2 IMPLANT
DRAPE MICROSCOPE LEICA (MISCELLANEOUS) ×2 IMPLANT
DRAPE MICROSCOPE ZEISS OPMI (DRAPES) IMPLANT
DRAPE POUCH INSTRU U-SHP 10X18 (DRAPES) ×2 IMPLANT
DRAPE SURG 17X23 STRL (DRAPES) ×2 IMPLANT
DRESSING TELFA 8X3 (GAUZE/BANDAGES/DRESSINGS) ×2 IMPLANT
DRSG OPSITE 4X5.5 SM (GAUZE/BANDAGES/DRESSINGS) ×2 IMPLANT
DURAPREP 26ML APPLICATOR (WOUND CARE) ×2 IMPLANT
ELECT REM PT RETURN 9FT ADLT (ELECTROSURGICAL) ×2
ELECTRODE REM PT RTRN 9FT ADLT (ELECTROSURGICAL) ×1 IMPLANT
GAUZE SPONGE 4X4 16PLY XRAY LF (GAUZE/BANDAGES/DRESSINGS) IMPLANT
GLOVE BIO SURGEON STRL SZ8 (GLOVE) ×2 IMPLANT
GLOVE BIOGEL PI IND STRL 7.0 (GLOVE) ×2 IMPLANT
GLOVE BIOGEL PI INDICATOR 7.0 (GLOVE) ×2
GLOVE ECLIPSE 8.5 STRL (GLOVE) ×2 IMPLANT
GLOVE SS BIOGEL STRL SZ 6.5 (GLOVE) ×2 IMPLANT
GLOVE SUPERSENSE BIOGEL SZ 6.5 (GLOVE) ×2
GOWN BRE IMP SLV AUR LG STRL (GOWN DISPOSABLE) ×2 IMPLANT
GOWN BRE IMP SLV AUR XL STRL (GOWN DISPOSABLE) ×4 IMPLANT
GOWN STRL REIN 2XL LVL4 (GOWN DISPOSABLE) IMPLANT
HEMOSTAT POWDER KIT SURGIFOAM (HEMOSTASIS) IMPLANT
KIT BASIN OR (CUSTOM PROCEDURE TRAY) ×2 IMPLANT
KIT ROOM TURNOVER OR (KITS) ×2 IMPLANT
NEEDLE HYPO 25X1 1.5 SAFETY (NEEDLE) ×2 IMPLANT
NEEDLE SPNL 20GX3.5 QUINCKE YW (NEEDLE) IMPLANT
NS IRRIG 1000ML POUR BTL (IV SOLUTION) ×2 IMPLANT
PACK LAMINECTOMY NEURO (CUSTOM PROCEDURE TRAY) ×2 IMPLANT
PAD ARMBOARD 7.5X6 YLW CONV (MISCELLANEOUS) ×6 IMPLANT
RUBBERBAND STERILE (MISCELLANEOUS) ×4 IMPLANT
SPONGE SURGIFOAM ABS GEL SZ50 (HEMOSTASIS) ×2 IMPLANT
STRIP CLOSURE SKIN 1/2X4 (GAUZE/BANDAGES/DRESSINGS) ×2 IMPLANT
SUT VIC AB 0 CT1 18XCR BRD8 (SUTURE) ×1 IMPLANT
SUT VIC AB 0 CT1 8-18 (SUTURE) ×1
SUT VIC AB 2-0 CP2 18 (SUTURE) ×2 IMPLANT
SUT VIC AB 3-0 SH 8-18 (SUTURE) ×4 IMPLANT
SYR 20ML ECCENTRIC (SYRINGE) ×2 IMPLANT
TOWEL OR 17X24 6PK STRL BLUE (TOWEL DISPOSABLE) ×2 IMPLANT
TOWEL OR 17X26 10 PK STRL BLUE (TOWEL DISPOSABLE) ×2 IMPLANT
WATER STERILE IRR 1000ML POUR (IV SOLUTION) ×2 IMPLANT

## 2013-06-07 NOTE — H&P (Signed)
Subjective: Patient is a 22 y.o. female admitted for L5-S1 microdiscectomy. Onset of symptoms was a few months ago, gradually worsening since that time.  The pain is rated severe, and is located at the across the lower back and radiates to right leg. The pain is described as aching and occurs all day. The symptoms have been progressive. Symptoms are exacerbated by exercise. MRI or CT showed right L5-S1 herniated disc.   Past Medical History  Diagnosis Date  . Depression   . Anxiety     Past Surgical History  Procedure Laterality Date  . Tonsillectomy    . Lumbar laminectomy/decompression microdiscectomy Left 10/11/2012    Procedure: LUMBAR LAMINECTOMY/DECOMPRESSION MICRODISCECTOMY 1 LEVEL;  Surgeon: Tia Alert, MD;  Location: [facility];  Service: Neurosurgery;  Laterality: Left;  left lumbar four-five    Prior to Admission medications   Medication Sig Start Date End Date Taking? Authorizing Provider  oxyCODONE-acetaminophen (PERCOCET/ROXICET) 5-325 MG per tablet Take 1-2 tablets by mouth every 6 (six) hours as needed for severe pain.   Yes Historical Provider, MD   No Known Allergies  History  Substance Use Topics  . Smoking status: Current Every Day Smoker -- 1.00 packs/day for 6 years    Types: Cigarettes  . Smokeless tobacco: Never Used  . Alcohol Use: No    History reviewed. No pertinent family history.   Review of Systems  Positive ROS: neg  All other systems have been reviewed and were otherwise negative with the exception of those mentioned in the HPI and as above.  Objective: Vital signs in last 24 hours: Temp:  [97.1 F (36.2 C)-98.7 F (37.1 C)] 97.1 F (36.2 C) (11/21 0625) Pulse Rate:  [94-97] 94 (11/21 0625) Resp:  [20] 20 (11/21 0625) BP: (124-138)/(73-87) 138/73 mmHg (11/21 0625) SpO2:  [97 %-99 %] 99 % (11/21 0625) Weight:  [77.928 kg (171 lb 12.8 oz)] 77.928 kg (171 lb 12.8 oz) (11/20 1524)  General Appearance: Alert, cooperative, no distress,  appears stated age Head: Normocephalic, without obvious abnormality, atraumatic Eyes: PERRL, conjunctiva/corneas clear, EOM's intact    Neck: Supple, symmetrical, trachea midline Back: Symmetric, no curvature, ROM normal, no CVA tenderness Lungs:  respirations unlabored Heart: Regular rate and rhythm Abdomen: Soft, non-tender Extremities: Extremities normal, atraumatic, no cyanosis or edema Pulses: 2+ and symmetric all extremities Skin: Skin color, texture, turgor normal, no rashes or lesions  NEUROLOGIC:   Mental status: Alert and oriented x4,  no aphasia, good attention span, fund of knowledge, and memory Motor Exam - grossly normal Sensory Exam - grossly normal Reflexes: 1+ Coordination - grossly normal Gait - grossly normal Balance - grossly normal Cranial Nerves: I: smell Not tested  II: visual acuity  OS: nl    OD: nl  II: visual fields Full to confrontation  II: pupils Equal, round, reactive to light  III,VII: ptosis None  III,IV,VI: extraocular muscles  Full ROM  V: mastication Normal  V: facial light touch sensation  Normal  V,VII: corneal reflex  Present  VII: facial muscle function - upper  Normal  VII: facial muscle function - lower Normal  VIII: hearing Not tested  IX: soft palate elevation  Normal  IX,X: gag reflex Present  XI: trapezius strength  5/5  XI: sternocleidomastoid strength 5/5  XI: neck flexion strength  5/5  XII: tongue strength  Normal    Data Review Lab Results  Component Value Date   WBC 6.4 06/06/2013   HGB 15.1* 06/06/2013   HCT 43.3  06/06/2013   MCV 91.0 06/06/2013   PLT 274 06/06/2013   Lab Results  Component Value Date   NA 138 06/06/2013   K 3.7 06/06/2013   CL 103 06/06/2013   CO2 21 06/06/2013   BUN 6 06/06/2013   CREATININE 0.60 06/06/2013   GLUCOSE 75 06/06/2013   Lab Results  Component Value Date   INR 0.93 06/06/2013    Assessment/Plan: Patient admitted for right L5-S1 microdiscectomy. Patient has failed a  reasonable attempt at conservative therapy.  I explained the condition and procedure to the patient and answered any questions.  Patient wishes to proceed with procedure as planned. Understands risks/ benefits and typical outcomes of procedure.   JONES,DAVID S 06/07/2013 7:14 AM

## 2013-06-07 NOTE — Transfer of Care (Signed)
Immediate Anesthesia Transfer of Care Note  Patient: Diana Richard  Procedure(s) Performed: Procedure(s) with comments: Right Lumbar five-sacral one microdiskectomy  (Right) - Right Lumbar five-sacral one microdiskectomy   Patient Location: PACU  Anesthesia Type:General  Level of Consciousness: awake, alert  and oriented  Airway & Oxygen Therapy: Patient Spontanous Breathing and Patient connected to face mask oxygen  Post-op Assessment: Report given to PACU RN, Post -op Vital signs reviewed and stable and Patient moving all extremities X 4  Post vital signs: Reviewed and stable  Complications: No apparent anesthesia complications

## 2013-06-07 NOTE — Progress Notes (Signed)
Pt. Alert and oriented, follows simple instructions, denies pain. Incision area without swelling, redness or S/S of infection. Voiding adequate clear yellow urine. Moving all extremities well and vitals stable and documented. Patient discharged home with family. Lumbar  surgery notes instructions given to patient and family member for home safety and precautions. Pt. and family stated understanding of instructions given. Pain meds given per Pt.'s request for pain and discomfort of ride home 

## 2013-06-07 NOTE — Anesthesia Postprocedure Evaluation (Signed)
  Anesthesia Post-op Note  Patient: Diana Richard  Procedure(s) Performed: Procedure(s) with comments: Right Lumbar five-sacral one microdiskectomy  (Right) - Right Lumbar five-sacral one microdiskectomy   Patient Location: PACU  Anesthesia Type:General  Level of Consciousness: awake, oriented, sedated and patient cooperative  Airway and Oxygen Therapy: Patient Spontanous Breathing  Post-op Pain: mild  Post-op Assessment: Post-op Vital signs reviewed, Patient's Cardiovascular Status Stable, Respiratory Function Stable, Patent Airway, No signs of Nausea or vomiting and Pain level controlled  Post-op Vital Signs: stable  Complications: No apparent anesthesia complications

## 2013-06-07 NOTE — Progress Notes (Signed)
Dr.Smith notified of pt eating spaghetti and bread at 0100-ok to proceed

## 2013-06-07 NOTE — Op Note (Signed)
06/07/2013  8:55 AM  PATIENT:  Diana Richard  22 y.o. female  PRE-OPERATIVE DIAGNOSIS:  Right L5-S1 herniated nucleus pulposus  With right L5 and S1 radiculopathy  POST-OPERATIVE DIAGNOSIS:  same  PROCEDURE:  Right L5-S1 hemilaminectomy, medial facetectomy, and foraminotomies followed by microdiscectomy utilizing microscopic dissection to decompress both L5 and the S1 nerve roots  SURGEON:  Marikay Alar, MD  ASSISTANTS: None  ANESTHESIA:   General  EBL: Less than 50 ml  Total I/O In: 1000 [I.V.:1000] Out: -   BLOOD ADMINISTERED:none  DRAINS: none   SPECIMEN:  No Specimen  INDICATION FOR PROCEDURE:  This patient presented with severe right leg pain. She an MRI which showed a herniated disc with a free fragment extending superiorly compressing the L5 and S1 nerve roots. She tried medical management without relief. Recommended a microdiscectomy. She understood the risks, benefits, and alternatives and potential outcomes and wished to proceed.  PROCEDURE DETAILS: The patient was taken to the operating room and after induction of adequate generalized endotracheal anesthesia, the patient was rolled into the prone position on the Wilson frame and all pressure points were padded. The lumbar region was cleaned and then prepped with DuraPrep and draped in the usual sterile fashion. 5 cc of local anesthesia was injected and then a dorsal midline incision was made and carried down to the lumbo sacral fascia. The fascia was opened and the paraspinous musculature was taken down in a subperiosteal fashion to expose L5-S1 on the right. Intraoperative x-ray confirmed my level, and then I used a combination of the high-speed drill and the Kerrison punches to perform a hemilaminectomy, medial facetectomy, and foraminotomy at L5-S1 on the right. The underlying yellow ligament was opened and removed in a piecemeal fashion to expose the underlying dura and exiting nerve root. I undercut the lateral  recess and dissected down until I was medial to and distal to the pedicle. Also dissected more superiorly because of the superior disc fragment under the L5 nerve root. The nerve root was well decompressed. We then gently retracted the nerve root medially with a retractor, coagulated the epidural venous vasculature, and found a superior fragment that was removed with a nerve hook and a pituitary rongeur. I incised the disc space. I performed a thorough intradiscal discectomy with pituitary rongeurs and curettes, until I had a nice decompression of the nerve root and the midline. I then palpated with a coronary dilator along the nerve root and into the foramen to assure adequate decompression. I felt no more compression of the nerve root. I irrigated with saline solution containing bacitracin. Achieved hemostasis with bipolar cautery, lined the dura with Gelfoam, and then closed the fascia with 0 Vicryl. I closed the subcutaneous tissues with 2-0 Vicryl and the subcuticular tissues with 3-0 Vicryl. The skin was then closed with benzoin and Steri-Strips. The drapes were removed, a sterile dressing was applied. The patient was awakened from general anesthesia and transferred to the recovery room in stable condition. At the end of the procedure all sponge, needle and instrument counts were correct.   PLAN OF CARE: Admit for overnight observation  PATIENT DISPOSITION:  PACU - hemodynamically stable.   Delay start of Pharmacological VTE agent (>24hrs) due to surgical blood loss or risk of bleeding:  yes

## 2013-06-07 NOTE — Anesthesia Preprocedure Evaluation (Addendum)
Anesthesia Evaluation  Patient identified by MRN, date of birth, ID band Patient awake    Reviewed: Allergy & Precautions, H&P , NPO status , Patient's Chart, lab work & pertinent test results  Airway       Dental   Pulmonary Current Smoker,          Cardiovascular     Neuro/Psych PSYCHIATRIC DISORDERS Anxiety Depression    GI/Hepatic   Endo/Other    Renal/GU      Musculoskeletal   Abdominal   Peds  Hematology   Anesthesia Other Findings   Reproductive/Obstetrics                          Anesthesia Physical Anesthesia Plan  ASA: I  Anesthesia Plan: General   Post-op Pain Management:    Induction: Intravenous  Airway Management Planned: Oral ETT  Additional Equipment:   Intra-op Plan:   Post-operative Plan: Extubation in OR  Informed Consent: I have reviewed the patients History and Physical, chart, labs and discussed the procedure including the risks, benefits and alternatives for the proposed anesthesia with the patient or authorized representative who has indicated his/her understanding and acceptance.     Plan Discussed with: CRNA, Anesthesiologist and Surgeon  Anesthesia Plan Comments:         Anesthesia Quick Evaluation

## 2013-06-07 NOTE — Plan of Care (Signed)
Problem: Consults Goal: Diagnosis - Spinal Surgery Outcome: Completed/Met Date Met:  06/07/13 Microdiscectomy

## 2013-06-11 ENCOUNTER — Encounter (HOSPITAL_COMMUNITY): Payer: Self-pay | Admitting: Neurological Surgery

## 2013-12-13 IMAGING — CR DG LUMBAR SPINE 2-3V
1 series · 1 of 1 positions shown · non-contrast
Comparison: None.

CLINICAL DATA: Intraoperative localization.

LUMBAR SPINE - 2-3 VIEW

[view not recorded]
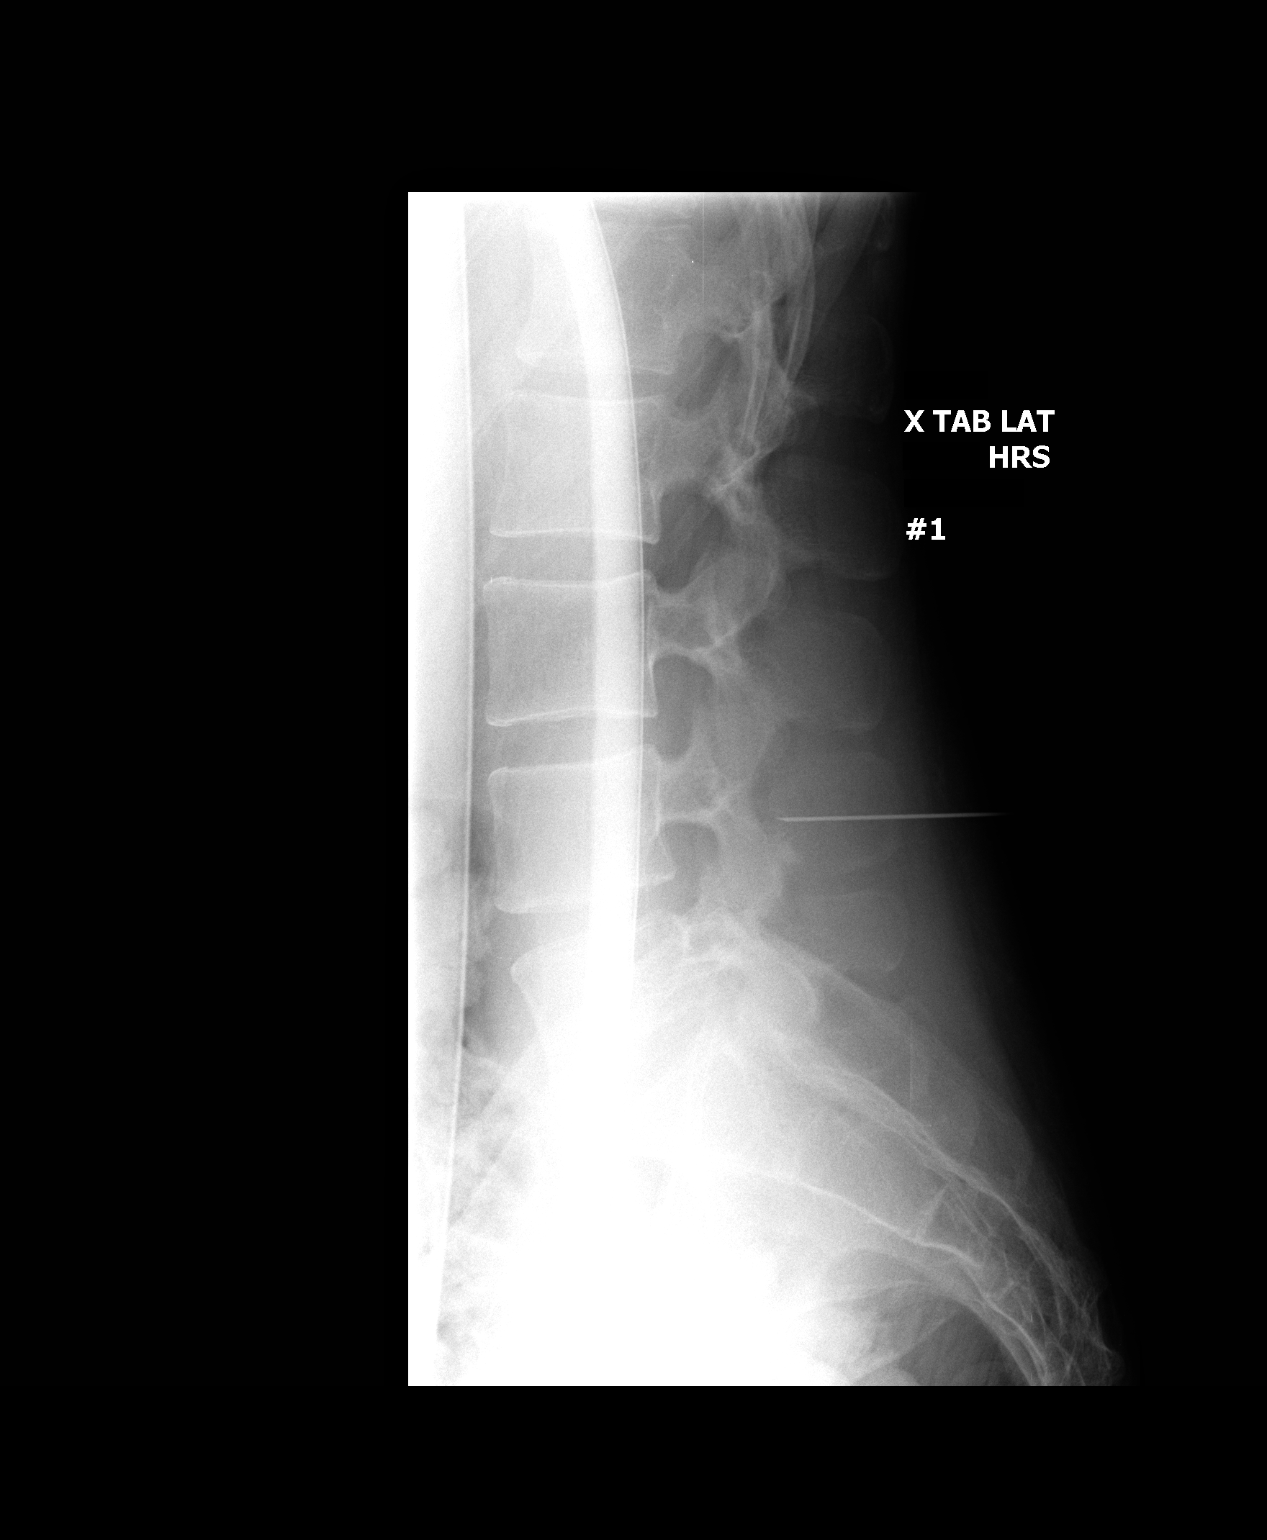

[1 of 1 positions shown; findings below may reference images not displayed]

FINDINGS: First lateral intraoperative image of the lumbar spine
demonstrates posterior needle directed at the L4 vertebral body.

Second lateral intraoperative image demonstrates posterior surgical
instruments at the L4-5 level.
IMPRESSION: Intraoperative localization as above.

## 2014-03-03 ENCOUNTER — Observation Stay: Payer: Self-pay | Admitting: Obstetrics & Gynecology

## 2014-03-08 ENCOUNTER — Observation Stay: Payer: Self-pay | Admitting: Obstetrics & Gynecology

## 2014-03-09 ENCOUNTER — Inpatient Hospital Stay: Payer: Self-pay

## 2014-03-09 LAB — CBC WITH DIFFERENTIAL/PLATELET
BASOS ABS: 0.1 10*3/uL (ref 0.0–0.1)
Basophil %: 0.4 %
EOS ABS: 0.1 10*3/uL (ref 0.0–0.7)
Eosinophil %: 0.9 %
HCT: 41.3 % (ref 35.0–47.0)
HGB: 14.1 g/dL (ref 12.0–16.0)
Lymphocyte #: 1.6 10*3/uL (ref 1.0–3.6)
Lymphocyte %: 11 %
MCH: 31.4 pg (ref 26.0–34.0)
MCHC: 34 g/dL (ref 32.0–36.0)
MCV: 92 fL (ref 80–100)
MONO ABS: 1.1 x10 3/mm — AB (ref 0.2–0.9)
Monocyte %: 7.4 %
NEUTROS PCT: 80.3 %
Neutrophil #: 12 10*3/uL — ABNORMAL HIGH (ref 1.4–6.5)
Platelet: 205 10*3/uL (ref 150–440)
RBC: 4.48 10*6/uL (ref 3.80–5.20)
RDW: 13.5 % (ref 11.5–14.5)
WBC: 14.9 10*3/uL — AB (ref 3.6–11.0)

## 2014-03-09 LAB — GC/CHLAMYDIA PROBE AMP

## 2014-03-11 LAB — HEMATOCRIT: HCT: 33.1 % — ABNORMAL LOW (ref 35.0–47.0)

## 2014-07-18 NOTE — L&D Delivery Note (Signed)
   Operative Delivery Note  Pregnancy complicated by Subutex maintanence 8mg  in am, 8 mg in pm and 4 mg at night.  At 9:13 PM a viable and healthy vigorous female "Diana Richard" was delivered via Vaginal, Vacuum Investment banker, operational(Extractor) for maternal exhaustion and deep variable decels over an intact perineum.  Presentation: vertex; Position: Occiput,, Anterior; Station: +3.  Verbal consent: obtained from patient.  Risks and benefits discussed in brief.  Risks include, but are not limited to the risks of anesthesia, bleeding, infection, damage to maternal tissues, fetal cephalhematoma.  There is also the risk of inability to effect vaginal delivery of the head, or shoulder dystocia that cannot be resolved by established maneuvers, leading to the need for emergency cesarean section.  APGAR: 8, 9; weight 6 lb 13 oz (3090 g).   Placenta status: Intact, Spontaneous.   Cord: 3 vessels with the following complications: None.  Cord pH: pending  Anesthesia: Epidural  Instruments: Kiwi flat vacuum extractor Episiotomy: None Lacerations: 2nd degree;Labial Suture Repair: 2.0 3.0 vicryl Est. Blood Loss (mL):  500  Concern for bleeding with pitocin running. 0.2mg  methergine given IM to assist in clamping down. Bleeding at the end of the case was minimal with firm fundus. Manual uterine evacuation of several large lower uterine clots.  Mom to postpartum.  Baby to Nursery.  [name], Amarylis Rovito 06/30/2015, 9:41 PM

## 2014-08-09 IMAGING — DX DG LUMBAR SPINE 1V
1 series · 1 of 1 positions shown · non-contrast
Comparison: Radiograph dated 10/11/2012

CLINICAL DATA: Lumbar disc disease.

EXAM:
LUMBAR SPINE - 1 VIEW

[lat]
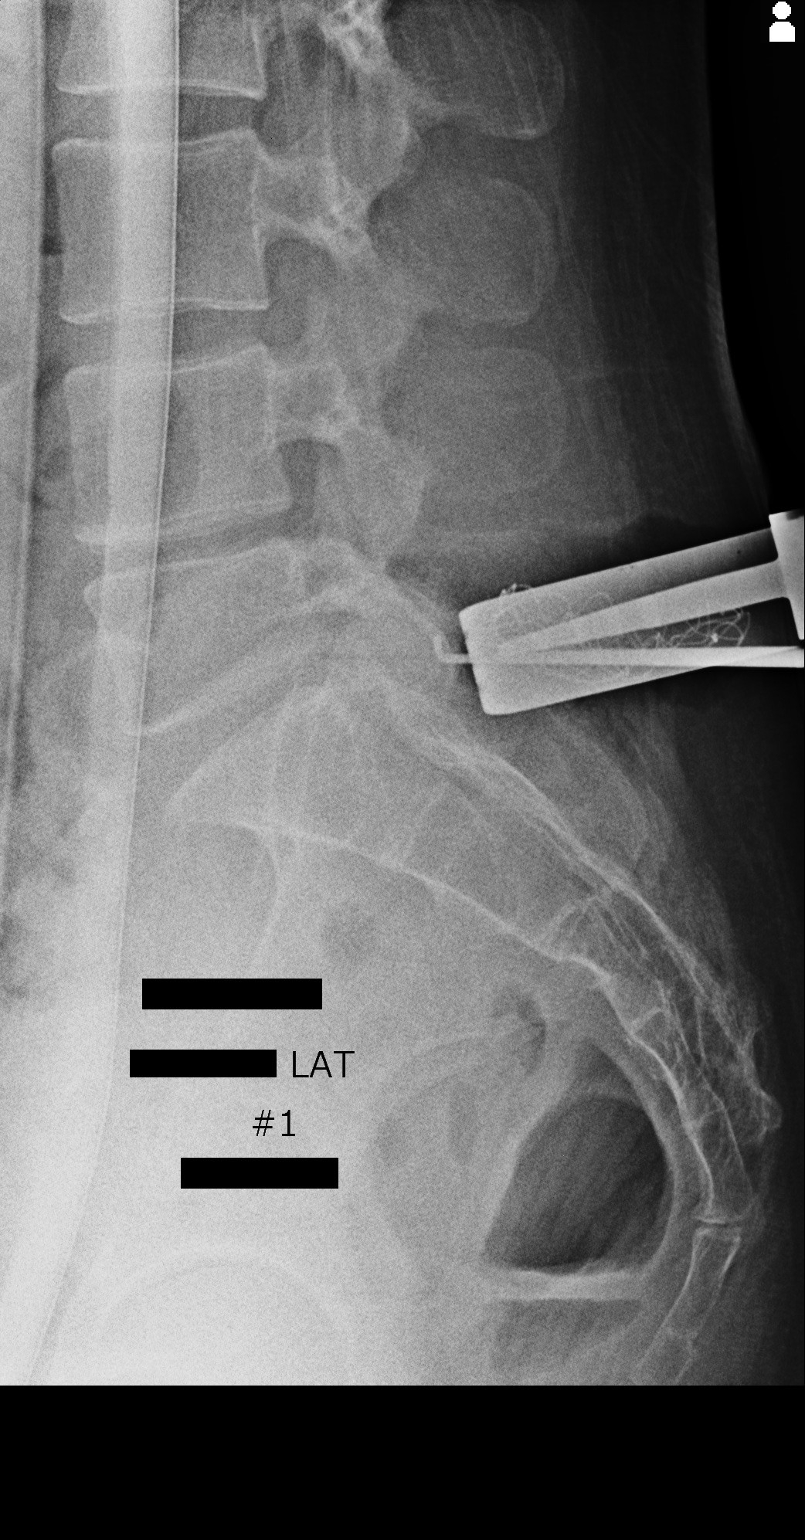

[1 of 1 positions shown; findings below may reference images not displayed]

FINDINGS: Instruments are present at the L5-S1 level.
IMPRESSION: Instruments at L5-S1.

## 2014-11-12 LAB — OB RESULTS CONSOLE RUBELLA ANTIBODY, IGM: Rubella: IMMUNE

## 2014-11-12 LAB — OB RESULTS CONSOLE RPR: RPR: NONREACTIVE

## 2014-11-12 LAB — OB RESULTS CONSOLE GC/CHLAMYDIA
CHLAMYDIA, DNA PROBE: NEGATIVE
Gonorrhea: NEGATIVE

## 2014-11-12 LAB — OB RESULTS CONSOLE ANTIBODY SCREEN: ANTIBODY SCREEN: NEGATIVE

## 2014-11-12 LAB — OB RESULTS CONSOLE ABO/RH: RH Type: POSITIVE

## 2014-11-12 LAB — OB RESULTS CONSOLE VARICELLA ZOSTER ANTIBODY, IGG: VARICELLA IGG: IMMUNE

## 2014-11-12 LAB — OB RESULTS CONSOLE HEPATITIS B SURFACE ANTIGEN: Hepatitis B Surface Ag: NEGATIVE

## 2014-11-12 LAB — OB RESULTS CONSOLE HIV ANTIBODY (ROUTINE TESTING): HIV: NONREACTIVE

## 2014-11-13 ENCOUNTER — Other Ambulatory Visit: Payer: Self-pay | Admitting: Obstetrics and Gynecology

## 2014-11-13 DIAGNOSIS — Z369 Encounter for antenatal screening, unspecified: Secondary | ICD-10-CM

## 2014-11-25 NOTE — H&P (Signed)
L&D Evaluation:  History:  HPI CC: uterine contractions HPI: 24 y/o G1 @ 38/5 (7wk u/s), with above CC. Preg c/b GBS pos and tobacco abuse. Patient endorsing q4469m UCs but no VB, LOF or decreased FM   Patient's Medical History No Chronic Illness   Patient's Surgical History none   Medications Pre Natal Vitamins   Allergies NKDA   Social History tobacco   Family History Non-Contributory   Exam:  Vital Signs AF VS normal and stable   General no apparent distress   Chest CTAB   Heart RRR, no MRGs   Abdomen gravid, non-tender   Estimated Fetal Weight Average for gestational age, 683400gm by leopolds   Fetal Position cephalic   Pelvic 4-5/80/-1, per RN at 1800   Mebranes Intact   FHT 160 baseline, +accels, no decels, mod var   Ucx q2-3   Impression:  Impression likely transitioning to active labor   Plan:  Comments *IUP: category I, fetal status reassuring *Term labor: contracting well on her own; won't augment until 2nd dose of abx; patient okay for intermittent monitoring *GBS pos: will start amp *Analgesia: not uncomfortable; can d/w patient the various options once she gets more uncomfortable  A pos/RI/VI/rpr pending/hiv neg/hepB neg/tdap declined/pap not applicable   Electronic Signatures: [place] BingPickens, Laster Appling (MD)  (Signed 23-Aug-15 18:51)  Authored: L&D Evaluation   Last Updated: 23-Aug-15 18:51 by [place] BingPickens, [name] (MD)

## 2014-11-25 NOTE — H&P (Signed)
L&D Evaluation:  History:  HPI CC: uterine contractions HPI: 24 y/o g1 @ 38/4 (LMP), with above CC. Preg c/b GBS pos and tobacco abuse. Irregular UCs and no VB, LOF or decreased FM   Patient's Medical History No Chronic Illness   Patient's Surgical History none   Medications Pre Natal Vitamins   Allergies NKDA   Social History tobacco   Family History Non-Contributory   Exam:  Vital Signs AF VS normal and stable   General no apparent distress   Chest CTAB   Heart RRR, no MRGs   Abdomen gravid, non-tender   Estimated Fetal Weight Average for gestational age   Fetal Position cephalic   Pelvic 3/70/-1 (unchanged from 8/19 PNV), per RN   Mebranes Intact   FHT 145 baseline, +accels, no decels, mod var   Ucx q2-614m   Impression:  Impression negative labor evaluation   Plan:  Comments *IUP: rNST, fetal status reassuring *EOL: negative. labor precautions given *GBS pos: patient lives nearby. tx with amp when in labor  Patient amenable to this plan  RTC 8/24, pt told to keep appt   Electronic Signatures: [place] BingPickens, Mariza Bourget (MD)  (Signed 22-Aug-15 17:02)  Authored: L&D Evaluation   Last Updated: 22-Aug-15 17:02 by [place] BingPickens, Sharry Beining (MD)

## 2014-12-22 ENCOUNTER — Other Ambulatory Visit: Payer: 59

## 2014-12-22 ENCOUNTER — Ambulatory Visit
Admission: RE | Admit: 2014-12-22 | Discharge: 2014-12-22 | Disposition: A | Discharge status: Home / Self Care | Payer: Medicaid Other | Source: Ambulatory Visit | Attending: Obstetrics & Gynecology | Admitting: Obstetrics & Gynecology

## 2014-12-22 VITALS — BP 128/64 | HR 105 | Temp 98.4°F | Wt 163.0 lb

## 2014-12-22 DIAGNOSIS — O99320 Drug use complicating pregnancy, unspecified trimester: Secondary | ICD-10-CM | POA: Diagnosis present

## 2014-12-22 DIAGNOSIS — Z36 Encounter for antenatal screening of mother: Secondary | ICD-10-CM | POA: Diagnosis not present

## 2014-12-22 DIAGNOSIS — Z79891 Long term (current) use of opiate analgesic: Secondary | ICD-10-CM | POA: Diagnosis not present

## 2014-12-22 DIAGNOSIS — O99322 Drug use complicating pregnancy, second trimester: Secondary | ICD-10-CM | POA: Insufficient documentation

## 2014-12-22 DIAGNOSIS — F1120 Opioid dependence, uncomplicated: Secondary | ICD-10-CM | POA: Diagnosis not present

## 2014-12-22 DIAGNOSIS — Z3A13 13 weeks gestation of pregnancy: Secondary | ICD-10-CM

## 2014-12-22 DIAGNOSIS — Z369 Encounter for antenatal screening, unspecified: Secondary | ICD-10-CM | POA: Insufficient documentation

## 2014-12-22 NOTE — Progress Notes (Addendum)
[facility] Length of Consultation: 30 minutes   Ms. Pillard  was referred to [facility] for genetic counseling to review prenatal screening and testing options.  This note summarizes the information we discussed.    First trimester screening, which can include nuchal translucency ultrasound screen and/or first trimester maternal serum marker screening.  The nuchal translucency has approximately an 80% detection rate for Down syndrome and can be positive for other chromosome abnormalities as well as congenital heart defects.  When combined with a maternal serum marker screening, the detection rate is up to 90% for Down syndrome and up to 97% for trisomy 18.     Maternal serum marker screening, a blood test that measures pregnancy proteins, can provide risk assessments for Down syndrome, trisomy 18, and open neural tube defects (spina bifida, anencephaly). Because it does not directly examine the fetus, it cannot positively diagnose or rule out these problems.  Targeted ultrasound uses high frequency sound waves to create an image of the developing fetus.  An ultrasound is often recommended as a routine means of evaluating the pregnancy.  It is also used to screen for fetal anatomy problems (for example, a heart defect) that might be suggestive of a chromosomal or other abnormality.   Should these screening tests indicate an increased concern, then the following diagnostic options would be offered:  The chorionic villus sampling procedure is available for first trimester chromosome analysis.  This involves the withdrawal of a small amount of chorionic villi (tissue from the developing placenta).  Risk of pregnancy loss is estimated to be approximately 1 in 200 to 1 in 100 (0.5 to 1%).  There is approximately a 1% (1 in 100) chance that the CVS chromosome results will be unclear.  Chorionic villi cannot be tested for neural tube defects.     Amniocentesis involves the  removal of a small amount of amniotic fluid from the sac surrounding the fetus with the use of a thin needle inserted through the maternal abdomen and uterus.  Ultrasound guidance is used throughout the procedure.  Fetal cells from amniotic fluid are directly evaluated and > 99.5% of chromosome problems and > 98% of open neural tube defects can be detected. This procedure is generally performed after the 15th week of pregnancy.  The main risks to this procedure include complications leading to miscarriage in less than 1 in 200 cases (0.5%).  As another option for information if the pregnancy is suspected to be an an increased chance for certain chromosome conditions, we also reviewed the availability of cell free fetal DNA testing from maternal blood to determine whether or not the baby may have either Down syndrome, trisomy 29, or trisomy 41.  This test utilizes a maternal blood sample and DNA sequencing technology to isolate circulating cell free fetal DNA from maternal plasma.  The fetal DNA can then be analyzed for DNA sequences that are derived from the three most common chromosomes involved in aneuploidy, chromosomes 13, 18, and 21.  If the overall amount of DNA is greater than the expected level for any of these chromosomes, aneuploidy is suspected.  This testing is commercially available, and is able to provide another means of determining the chance for one of these common chromosome conditions, without requiring an invasive procedure and traditional karyotype analysis.  While this is new technology, the testing does show great promise, and we offered it as an option.  We discussed this option with her in detail.  We explained  that while we do not consider it a replacement for invasive testing and karyotype analysis, a negative result from this testing would be reassuring, though not a guarantee of a normal chromosome complement for the baby.  An abnormal result is certainly suggestive of an abnormal  chromosome complement, though we would still recommend CVS or amniocentesis to confirm any findings from this testing.   Cystic Fibrosis screening was also discussed with the patient. Cystic fibrosis (CF) is one of the most common genetic conditions in persons of Caucasian ancestry.  This condition occurs in approximately 1 in 2,500 Caucasian persons and results in thickened secretions in the lungs, digestive, and reproductive systems.  For a baby to be at risk for having CF, both of the parents must be carriers for this condition.  Approximately 1 in 9425 Caucasian persons is a carrier for CF.  Current carrier testing looks for the most common mutations in the gene for CF and can detect approximately 90% of carriers in the Caucasian population.  This means that the carrier screening can greatly reduce, but cannot eliminate, the chance for an individual to have a child with CF.  If an individual is found to be a carrier for CF, then carrier testing would be available for the partner. As part of [place] newborn screening profile, all babies born in the state of [place] will have a two-tier screening process.  Specimens are first tested to determine the concentration of immunoreactive trypsinogen (IRT).  The top 5% of specimens with the highest IRT values then undergo DNA testing using a panel of over 40 common CF mutations.   We obtained a detailed family history and pregnancy history.  The remainder of the family history was reported to be unremarkable for birth defects, mental retardation, recurrent pregnancy loss or known chromosome abnormalities.  Ms. Kathlene NovemberMcCormick stated that this is the second pregnancy for she and her partner.  They have a 878 month old son who is in good health.  She reported no complications in the current pregnancy and no exposure to alcohol or recreational drugs.  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 This is  down from 1 1/2 packs per day prior to pregnancy.  As we discussed, smoking during pregnancy has been associated with low birth weight, premature delivery and pregnancy loss.  For this reason, we suggest that she cut back or avoid smoking for the remainder of the pregnancy. Based on available data, buprenorphine use in pregnancy is not expected to increase the risk for birth defects.  It may be associated with neonatal withdrawal and later behavioral differences.    After consideration of the options, Ms. Kitzmiller elected to proceed with an ultrasound for dating.  She declined formal first trimester screening.  An ultrasound was performed at the time of the visit.  The gestational age was consistent with  13 weeks.  Fetal anatomy could not be assessed due to early gestational age.  Please refer to the ultrasound report for details of that study.  Ms. Kathlene NovemberMcCormick was encouraged to call with questions or concerns.  We can be contacted at [phone].   Cherly Andersoneborah F. Wells, MS, CGC   I was available for this consult and saw the patient.Leone Brand. Najat Olazabal, [place] A, MD

## 2015-01-26 ENCOUNTER — Ambulatory Visit
Admission: RE | Admit: 2015-01-26 | Discharge: 2015-01-26 | Disposition: A | Discharge status: Home / Self Care | Payer: Medicaid Other | Source: Ambulatory Visit | Attending: Obstetrics and Gynecology | Admitting: Obstetrics and Gynecology

## 2015-01-26 DIAGNOSIS — Z3A13 13 weeks gestation of pregnancy: Secondary | ICD-10-CM | POA: Diagnosis not present

## 2015-01-26 DIAGNOSIS — O99320 Drug use complicating pregnancy, unspecified trimester: Secondary | ICD-10-CM | POA: Insufficient documentation

## 2015-01-26 DIAGNOSIS — Z79891 Long term (current) use of opiate analgesic: Secondary | ICD-10-CM | POA: Diagnosis not present

## 2015-01-26 DIAGNOSIS — F1120 Opioid dependence, uncomplicated: Secondary | ICD-10-CM | POA: Diagnosis not present

## 2015-01-26 LAB — US OB DETAIL + 14 WK

## 2015-05-22 LAB — OB RESULTS CONSOLE GBS: GBS: NEGATIVE

## 2015-05-22 LAB — OB RESULTS CONSOLE GC/CHLAMYDIA
Chlamydia: NEGATIVE
GC PROBE AMP, GENITAL: NEGATIVE

## 2015-06-03 ENCOUNTER — Inpatient Hospital Stay
Admission: EM | Admit: 2015-06-03 | Discharge: 2015-06-03 | Disposition: A | Discharge status: Home / Self Care | Payer: Medicaid Other | Attending: Obstetrics and Gynecology | Admitting: Obstetrics and Gynecology

## 2015-06-03 DIAGNOSIS — Z369 Encounter for antenatal screening, unspecified: Secondary | ICD-10-CM

## 2015-06-03 DIAGNOSIS — Z3493 Encounter for supervision of normal pregnancy, unspecified, third trimester: Secondary | ICD-10-CM | POA: Insufficient documentation

## 2015-06-03 DIAGNOSIS — F1910 Other psychoactive substance abuse, uncomplicated: Secondary | ICD-10-CM | POA: Insufficient documentation

## 2015-06-03 DIAGNOSIS — Z3A37 37 weeks gestation of pregnancy: Secondary | ICD-10-CM | POA: Diagnosis not present

## 2015-06-03 DIAGNOSIS — O368390 Maternal care for abnormalities of the fetal heart rate or rhythm, unspecified trimester, not applicable or unspecified: Secondary | ICD-10-CM | POA: Diagnosis present

## 2015-06-03 LAB — SURGICAL PCR SCREEN
MRSA, PCR: NEGATIVE
Staphylococcus aureus: POSITIVE — AB

## 2015-06-03 NOTE — Final Progress Note (Signed)
TRIAGE VISIT with NST   Diana Richard is a 24 y.o. G2P1. She is at 3616w1d gestation.  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 Pt also has hx of MRSA.  S: Resting comfortably. no CTX, no VB. Active fetal movement.  O:  BP 114/67 mmHg  Pulse 96  Temp(Src) 99.1 F (37.3 C) (Oral)  Resp 18  LMP 09/16/2014 No results found for this or any previous visit (from the past 48 hour(s)).   Gen: NAD, AAOx3      Abd: FNTTP      Ext: Non-tender, Nonedmeatous    FHT: baseline 145, mod var, +accels no decels TOCO: quiet SVE:     A/P:  24 y.o. G2P1 916w1d with Cat II strip in the clinic.   Labor: not present.   Plan for prolonged monitoring x4hrs. If remains Cat I, will d/c home  MRSA hx: plan for PCR assessment per protocol. Contact precautions while here.  Fetal Wellbeing: Reassuring Cat 1 tracing.  D/c home stable, precautions reviewed, follow-up as scheduled.

## 2015-06-03 NOTE — OB Triage Note (Signed)
Ms. Diana Richard here for monitoring following possible fetal variables in the office this AM. Pt denies any pain or other complaints

## 2015-06-28 ENCOUNTER — Other Ambulatory Visit: Payer: Self-pay | Admitting: Obstetrics and Gynecology

## 2015-06-30 ENCOUNTER — Inpatient Hospital Stay: Payer: Medicaid Other | Admitting: Anesthesiology

## 2015-06-30 ENCOUNTER — Inpatient Hospital Stay
Admission: EM | Admit: 2015-06-30 | Discharge: 2015-07-02 | DRG: 775 | Disposition: A | Discharge status: Home / Self Care | Payer: Medicaid Other | Attending: Obstetrics and Gynecology | Admitting: Obstetrics and Gynecology

## 2015-06-30 ENCOUNTER — Encounter: Payer: Self-pay | Admitting: *Deleted

## 2015-06-30 DIAGNOSIS — F329 Major depressive disorder, single episode, unspecified: Secondary | ICD-10-CM | POA: Diagnosis present

## 2015-06-30 DIAGNOSIS — F419 Anxiety disorder, unspecified: Secondary | ICD-10-CM | POA: Diagnosis present

## 2015-06-30 DIAGNOSIS — F1120 Opioid dependence, uncomplicated: Secondary | ICD-10-CM | POA: Diagnosis present

## 2015-06-30 DIAGNOSIS — O480 Post-term pregnancy: Secondary | ICD-10-CM | POA: Diagnosis present

## 2015-06-30 DIAGNOSIS — O99334 Smoking (tobacco) complicating childbirth: Secondary | ICD-10-CM | POA: Diagnosis present

## 2015-06-30 DIAGNOSIS — Z3A41 41 weeks gestation of pregnancy: Secondary | ICD-10-CM | POA: Diagnosis not present

## 2015-06-30 DIAGNOSIS — O99324 Drug use complicating childbirth: Secondary | ICD-10-CM | POA: Diagnosis present

## 2015-06-30 DIAGNOSIS — O99344 Other mental disorders complicating childbirth: Secondary | ICD-10-CM | POA: Diagnosis present

## 2015-06-30 DIAGNOSIS — F17210 Nicotine dependence, cigarettes, uncomplicated: Secondary | ICD-10-CM | POA: Diagnosis present

## 2015-06-30 LAB — CBC
HCT: 36 % (ref 35.0–47.0)
Hemoglobin: 12.2 g/dL (ref 12.0–16.0)
MCH: 30.5 pg (ref 26.0–34.0)
MCHC: 33.9 g/dL (ref 32.0–36.0)
MCV: 90.1 fL (ref 80.0–100.0)
PLATELETS: 188 10*3/uL (ref 150–440)
RBC: 3.99 MIL/uL (ref 3.80–5.20)
RDW: 13.5 % (ref 11.5–14.5)
WBC: 9.2 10*3/uL (ref 3.6–11.0)

## 2015-06-30 LAB — URINE DRUG SCREEN, QUALITATIVE (ARMC ONLY)
Amphetamines, Ur Screen: NOT DETECTED
BARBITURATES, UR SCREEN: NOT DETECTED
BENZODIAZEPINE, UR SCRN: NOT DETECTED
CANNABINOID 50 NG, UR ~~LOC~~: NOT DETECTED
Cocaine Metabolite,Ur ~~LOC~~: NOT DETECTED
MDMA (Ecstasy)Ur Screen: NOT DETECTED
METHADONE SCREEN, URINE: NOT DETECTED
OPIATE, UR SCREEN: NOT DETECTED
PHENCYCLIDINE (PCP) UR S: NOT DETECTED
Tricyclic, Ur Screen: NOT DETECTED

## 2015-06-30 LAB — TYPE AND SCREEN
ABO/RH(D): A POS
Antibody Screen: NEGATIVE

## 2015-06-30 LAB — URINALYSIS COMPLETE WITH MICROSCOPIC (ARMC ONLY)
BILIRUBIN URINE: NEGATIVE
Glucose, UA: NEGATIVE mg/dL
Ketones, ur: NEGATIVE mg/dL
Nitrite: NEGATIVE
PH: 5 (ref 5.0–8.0)
Protein, ur: 30 mg/dL — AB
Specific Gravity, Urine: 1.031 — ABNORMAL HIGH (ref 1.005–1.030)

## 2015-06-30 LAB — ABO/RH: ABO/RH(D): A POS

## 2015-06-30 MED ORDER — LIDOCAINE HCL (PF) 1 % IJ SOLN
INTRAMUSCULAR | Status: AC
  Filled 2015-06-30: qty 30

## 2015-06-30 MED ORDER — LIDOCAINE HCL (PF) 1 % IJ SOLN
30.0000 mL | INTRAMUSCULAR | Status: DC | PRN
  Filled 2015-06-30: qty 30

## 2015-06-30 MED ORDER — BUPRENORPHINE HCL 8 MG SL SUBL
8.0000 mg | SUBLINGUAL_TABLET | Freq: Two times a day (BID) | SUBLINGUAL | Status: DC
  Administered 2015-06-30 – 2015-07-02 (×4): 8 mg via SUBLINGUAL
  Filled 2015-06-30 (×3): qty 1

## 2015-06-30 MED ORDER — ONDANSETRON HCL 4 MG/2ML IJ SOLN: 4.0000 mg | Freq: Four times a day (QID) | INTRAMUSCULAR | Status: DC | PRN

## 2015-06-30 MED ORDER — METHYLERGONOVINE MALEATE 0.2 MG/ML IJ SOLN
INTRAMUSCULAR | Status: AC
  Administered 2015-06-30: 0.2 mg via INTRAMUSCULAR
  Filled 2015-06-30: qty 1

## 2015-06-30 MED ORDER — METHYLERGONOVINE MALEATE 0.2 MG/ML IJ SOLN
0.2000 mg | Freq: Once | INTRAMUSCULAR | Status: AC
  Administered 2015-06-30: 0.2 mg via INTRAMUSCULAR

## 2015-06-30 MED ORDER — CITRIC ACID-SODIUM CITRATE 334-500 MG/5ML PO SOLN
30.0000 mL | ORAL | Status: DC | PRN
Start: 2015-06-30 — End: 2015-07-01

## 2015-06-30 MED ORDER — LACTATED RINGERS IV SOLN
INTRAVENOUS | Status: DC
  Administered 2015-06-30: 125 mL/h via INTRAVENOUS
  Administered 2015-06-30: 19:00:00 via INTRAVENOUS
  Administered 2015-06-30: 125 mL/h via INTRAVENOUS

## 2015-06-30 MED ORDER — OXYTOCIN 40 UNITS IN LACTATED RINGERS INFUSION - SIMPLE MED
62.5000 mL/h | INTRAVENOUS | Status: DC
  Filled 2015-06-30: qty 1000

## 2015-06-30 MED ORDER — OXYTOCIN 10 UNIT/ML IJ SOLN
INTRAMUSCULAR | Status: AC
  Filled 2015-06-30: qty 2

## 2015-06-30 MED ORDER — MISOPROSTOL 200 MCG PO TABS
ORAL_TABLET | ORAL | Status: DC
Start: 2015-06-30 — End: 2015-07-01
  Filled 2015-06-30: qty 4

## 2015-06-30 MED ORDER — LACTATED RINGERS IV SOLN
500.0000 mL | INTRAVENOUS | Status: DC | PRN
  Administered 2015-06-30 (×2): 500 mL via INTRAVENOUS
  Administered 2015-06-30: 200 mL via INTRAVENOUS

## 2015-06-30 MED ORDER — OXYTOCIN BOLUS FROM INFUSION
500.0000 mL | INTRAVENOUS | Status: DC
  Administered 2015-06-30: 500 mL via INTRAVENOUS

## 2015-06-30 MED ORDER — OXYTOCIN 40 UNITS IN LACTATED RINGERS INFUSION - SIMPLE MED
1.0000 m[IU]/min | INTRAVENOUS | Status: DC
  Administered 2015-06-30: 7 m[IU]/min via INTRAVENOUS
  Administered 2015-06-30: 8 m[IU]/min via INTRAVENOUS
  Administered 2015-06-30: 9 m[IU]/min via INTRAVENOUS
  Administered 2015-06-30: 3 m[IU]/min via INTRAVENOUS
  Administered 2015-06-30: 1 m[IU]/min via INTRAVENOUS
  Administered 2015-06-30: 5 m[IU]/min via INTRAVENOUS
  Administered 2015-06-30: 6 m[IU]/min via INTRAVENOUS
  Administered 2015-06-30: 2 m[IU]/min via INTRAVENOUS

## 2015-06-30 MED ORDER — ACETAMINOPHEN 325 MG PO TABS: 650.0000 mg | ORAL_TABLET | ORAL | Status: DC | PRN

## 2015-06-30 MED ORDER — TERBUTALINE SULFATE 1 MG/ML IJ SOLN: 0.2500 mg | Freq: Once | INTRAMUSCULAR | Status: DC | PRN

## 2015-06-30 MED ORDER — FENTANYL 2.5 MCG/ML W/ROPIVACAINE 0.2% IN NS 100 ML EPIDURAL INFUSION (ARMC-ANES)
EPIDURAL | Status: AC
  Administered 2015-06-30: 10 mL/h via EPIDURAL
  Filled 2015-06-30: qty 100

## 2015-06-30 MED ORDER — AMMONIA AROMATIC IN INHA
RESPIRATORY_TRACT | Status: DC
Start: 2015-06-30 — End: 2015-07-01
  Filled 2015-06-30: qty 10

## 2015-06-30 MED ORDER — BUPIVACAINE HCL (PF) 0.25 % IJ SOLN
INTRAMUSCULAR | Status: DC | PRN
  Administered 2015-06-30: 5 mL via EPIDURAL

## 2015-06-30 MED ORDER — CEFAZOLIN SODIUM-DEXTROSE 2-3 GM-% IV SOLR
INTRAVENOUS | Status: AC
  Filled 2015-06-30: qty 50

## 2015-06-30 NOTE — Progress Notes (Signed)
Diana HumphreyRachel E Richard is a 24 y.o. G2P1 at 3019w0d   Subjective: Feeling  Contractions q3-5 min, pain at level 4  Objective: BP 103/54 mmHg  Pulse 69  Temp(Src) 98.3 F (36.8 C) (Oral)  LMP 09/16/2014   Total I/O In: 1150 [I.V.:1150] Out: -   FHT:  FHR: 145 bpm, variability: moderate,  accelerations:  Present,  decelerations:  Present variable decels, last 2 min decel 1 hr ago UC:   regular, every 4 minutes SVE:   Dilation: 4 Effacement (%): 70 Station: -2 Exam by:: chg rnc  Labs: Lab Results  Component Value Date   WBC 9.2 06/30/2015   HGB 12.2 06/30/2015   HCT 36.0 06/30/2015   MCV 90.1 06/30/2015   PLT 188 06/30/2015    Assessment / Plan: Induction of labor due to late term pregnancy,  progressing well on pitocin  Labor: Progressing on Pitocin, will continue gentle pitocin titration, as I have a lot of patience with this baby and would like a vaginal delivery without fetal distress. Currently excellent variability. Fetal Wellbeing:  Category I currently. After 3 min decel at 0930 this morning, FHT have been Cat I mostly, with occassional variable decels and a single several minute decel that responded to conservative measures. Continuous fetal monitoring planned. Pain Control:  Labor support without medications, plan for epidural when desired. I/D:  n/a Anticipated MOD:  NSVD  [name] 06/30/2015, 5:43 PM

## 2015-06-30 NOTE — Plan of Care (Signed)
MD Dalbert GarnetBeasley [facility] Pt deceleration information given to MD. She is on her way. Ellison Carwin Lytle [place] RNC

## 2015-06-30 NOTE — Anesthesia Procedure Notes (Signed)
Epidural Patient location during procedure: OB  Staffing Anesthesiologist: Berdine AddisonHOMAS, Gedalia Mcmillon Performed by: anesthesiologist   Preanesthetic Checklist Completed: patient identified, site marked, surgical consent, pre-op evaluation, timeout performed, IV checked, risks and benefits discussed and monitors and equipment checked  Epidural Patient position: sitting Prep: Betadine Patient monitoring: heart rate, continuous pulse ox and blood pressure Approach: midline Location: L4-L5 Injection technique: LOR saline  Needle:  Needle type: Tuohy  Needle gauge: 18 G Needle length: 9 cm and 9 Catheter type: closed end flexible Catheter size: 20 Guage Test dose: negative and 1.5% lidocaine with Epi 1:200 K  Assessment Sensory level: T10 Events: blood not aspirated, injection not painful, no injection resistance, negative IV test and no paresthesia  Additional Notes   Patient tolerated the insertion well without complications. IN [place].[id number]. Catheter in 1907. Test 1908. Bolus 1910 5 ml of .25%marcaine. Infusion 1918.Reason for block:procedure for pain

## 2015-06-30 NOTE — Anesthesia Preprocedure Evaluation (Addendum)
Anesthesia Evaluation  Patient identified by MRN, date of birth, ID band Patient awake    Reviewed: Allergy & Precautions, NPO status , Patient's Chart, lab work & pertinent test results, reviewed documented beta blocker date and time   Airway Mallampati: II  TM Distance: >3 FB     Dental  (+) Chipped   Pulmonary Current Smoker,           Cardiovascular      Neuro/Psych PSYCHIATRIC DISORDERS Anxiety Depression    GI/Hepatic   Endo/Other    Renal/GU      Musculoskeletal   Abdominal   Peds  Hematology   Anesthesia Other Findings Hx of narc dependency. Lumbar Lam. Smoker. Methadone.  Reproductive/Obstetrics                            Anesthesia Physical Anesthesia Plan  ASA: II  Anesthesia Plan: Epidural   Post-op Pain Management:    Induction:   Airway Management Planned:   Additional Equipment:   Intra-op Plan:   Post-operative Plan:   Informed Consent: I have reviewed the patients History and Physical, chart, labs and discussed the procedure including the risks, benefits and alternatives for the proposed anesthesia with the patient or authorized representative who has indicated his/her understanding and acceptance.     Plan Discussed with: CRNA  Anesthesia Plan Comments:         Anesthesia Quick Evaluation

## 2015-06-30 NOTE — Plan of Care (Signed)
[facility] to pass to Dr. Cleatis PolkaAuten Neo today. Pt is on subutex and is also having decelerations with her current FHR tracing. Pt has been talked to per Dr. Dalbert GarnetBeasley about possible c/section needs if fhr tracing shows need. Pt and Bobbye CharlestonS O Alex, agree with plan of care. Dr. Cleatis PolkaAuten to come and speak with pt concerning need for infant to remain in hospital for five days due to mother's history. Ellison Carwin Rage Beever RNC

## 2015-06-30 NOTE — H&P (Signed)
  OB ADMISSION/ HISTORY & PHYSICAL:  Admission Date: 06/30/2015  8:18 AM  Admit Diagnosis: 41 weeks Induction of Labor for Postdates  Diana Richard is a 24 y.o. female presenting for Induction of Labor at 41 weeks for postdates.   Prenatal History: G2P1   EDC : 06/23/2015, by Last Menstrual Period 09/16/14, with an EDD of 06/23/15 [facility]  Prenatal course complicated by history of Narcotic Dependence Subutex 8mg  SL 2.5x daily  Prenatal Labs: ABO, Rh:  A Positive  Antibody:  Negative Rubella:   Immune Varicella: Immune RPR:   NR HBsAg:   Negative HIV:   Negative GTT: 107 GBS:   Negative   Medical / Surgical History :  Past medical history:  Past Medical History  Diagnosis Date  . Depression   . Anxiety      Past surgical history:  Past Surgical History  Procedure Laterality Date  . Tonsillectomy    . Lumbar laminectomy/decompression microdiscectomy Left 10/11/2012    Procedure: LUMBAR LAMINECTOMY/DECOMPRESSION MICRODISCECTOMY 1 LEVEL;  Surgeon: Tia Alertavid S Jones, MD;  Location: [facility];  Service: Neurosurgery;  Laterality: Left;  left lumbar four-five  . Lumbar laminectomy/decompression microdiscectomy Right 06/07/2013    Procedure: Right Lumbar five-sacral one microdiskectomy ;  Surgeon: Tia Alertavid S Jones, MD;  Location: [facility];  Service: Neurosurgery;  Laterality: Right;  Right Lumbar five-sacral one microdiskectomy     Family History: No family history on file.   Social History:  reports that she has been smoking Cigarettes.  She has a 1.5 pack-year smoking history. She has never used smokeless tobacco. She reports that she does not drink alcohol or use illicit drugs.   Allergies: Review of patient's allergies indicates no known allergies.    Current Medications at time of admission:  Prior to Admission medications   Medication Sig Start Date End Date Taking? Authorizing Provider  buprenorphine (SUBUTEX) 8 MG SUBL SL tablet Place 8  mg under the tongue daily.    Historical Provider, MD  oxyCODONE-acetaminophen (PERCOCET/ROXICET) 5-325 MG per tablet Take 1-2 tablets by mouth every 6 (six) hours as needed for severe pain.    Historical Provider, MD  Prenatal Vit-Fe Fumarate-FA (PRENATAL MULTIVITAMIN) TABS tablet Take 1 tablet by mouth daily at 12 noon.    Historical Provider, MD  promethazine (PHENERGAN) 25 MG tablet Take 25 mg by mouth every 6 (six) hours as needed for nausea or vomiting.    Historical Provider, MD     Review of Systems: Active FM Irregular contractions No LOF, bloody show   Physical Exam:  VS: Last menstrual period 09/16/2014. General: alert and oriented, appears calm Heart: RRR Lungs: Clear lung fields Abdomen: Gravid, soft and non-tender, non-distended / uterus: non-tender, gravid Extremities: no edema  Genitalia / VE: Dilation: 2.5 Effacement (%): 70 Station: -2 Exam by:: chg rnc  FHR: baseline rate 130 / variability Moderate / accelerations- none  / 1 variable decelerations TOCO: irregular  Assessment: [redacted] weeks gestation Induction stage of labor FHR category 2   Plan:  IOL at 41 weeks Pitocin - begin 2 milliunits and increase by 2 milliunits - discussed we will closely monitor fetal tracing  Routine Labor and Delivery Orders May have epidural upon request  Notify NICU of patient's history Plan to continue Subutex during admission   Dr Bernestine AmassBeasely notified of admission / plan of care  Diana Richard, CNM

## 2015-06-30 NOTE — Progress Notes (Signed)
Diana HumphreyRachel E Abrahamsen is a 24 y.o. G2P1 at 6631w0d admitted for late term induction. Hx of maintenance Subutex use 8mg  SL TID per patient report. Confirmation with prescribing facility pending. Urine drug screen pending. Prior drug screen in pregnancy only positive for subutex, naproxen, acetaminophen and promethazine.  I was called to patient's room for spontaneous fetal deceleration to the 70s for 2 1/2 minutes, which had resolved with conservative measures prior to my getting into the room. Pt lying on right side with O2 applied. No pitocin had yet been started. No contractions, some irritability.  Fetal monitoring with NST weekly during the third trimester, with several decel events prompting extended monitoring in triage. Currently Cat I strip with mod variability, no accels, no decels in the 130s.  In general, minimal reactivity and a minimal to moderate baseline is this baby's norm.  Objective: BP 147/83 mmHg  Pulse 86  Temp(Src) 98.2 F (36.8 C) (Oral)  LMP 09/16/2014      SVE:   Dilation: 2.5 Effacement (%): 70 Station: -2 Exam by:: chg rnc  Labs: Lab Results  Component Value Date   WBC 14.9* 03/09/2014   HGB 14.1 03/09/2014   HCT 33.1* 03/11/2014   MCV 92 03/09/2014   PLT 205 03/09/2014    Assessment / Plan:  Discussion of fetal decels pathophysiology and our plan of care with patient and husband. Plan on starting pitocin at this time with continuous fetal monitoring. Pt aware I think it more likely that fetal intolerance to labor is likely, and we did sign informed consent for pLTCS with patient with the r/b/a discussed.  Early epidural as needed.   Admission labs still pending. Will follow closely.   [name], Bianca Vester 06/30/2015, 9:48 AM

## 2015-07-01 DIAGNOSIS — O368390 Maternal care for abnormalities of the fetal heart rate or rhythm, unspecified trimester, not applicable or unspecified: Secondary | ICD-10-CM

## 2015-07-01 LAB — CBC
HEMATOCRIT: 33.3 % — AB (ref 35.0–47.0)
Hemoglobin: 11.3 g/dL — ABNORMAL LOW (ref 12.0–16.0)
MCH: 30.4 pg (ref 26.0–34.0)
MCHC: 33.8 g/dL (ref 32.0–36.0)
MCV: 90.1 fL (ref 80.0–100.0)
Platelets: 149 10*3/uL — ABNORMAL LOW (ref 150–440)
RBC: 3.7 MIL/uL — ABNORMAL LOW (ref 3.80–5.20)
RDW: 13.4 % (ref 11.5–14.5)
WBC: 9.5 10*3/uL (ref 3.6–11.0)

## 2015-07-01 LAB — RPR: RPR Ser Ql: NONREACTIVE

## 2015-07-01 MED ORDER — LANOLIN HYDROUS EX OINT: TOPICAL_OINTMENT | CUTANEOUS | Status: DC | PRN

## 2015-07-01 MED ORDER — OXYTOCIN 40 UNITS IN LACTATED RINGERS INFUSION - SIMPLE MED: 62.5000 mL/h | INTRAVENOUS | Status: DC | PRN

## 2015-07-01 MED ORDER — BISACODYL 10 MG RE SUPP: 10.0000 mg | Freq: Every day | RECTAL | Status: DC | PRN

## 2015-07-01 MED ORDER — BENZOCAINE-MENTHOL 20-0.5 % EX AERO
1.0000 "application " | INHALATION_SPRAY | CUTANEOUS | Status: DC | PRN
  Filled 2015-07-01: qty 56

## 2015-07-01 MED ORDER — SODIUM CHLORIDE 0.9 % IV SOLN: 250.0000 mL | INTRAVENOUS | Status: DC | PRN

## 2015-07-01 MED ORDER — ONDANSETRON HCL 4 MG/2ML IJ SOLN: 4.0000 mg | INTRAMUSCULAR | Status: DC | PRN

## 2015-07-01 MED ORDER — DIBUCAINE 1 % RE OINT: 1.0000 "application " | TOPICAL_OINTMENT | RECTAL | Status: DC | PRN

## 2015-07-01 MED ORDER — ONDANSETRON HCL 4 MG PO TABS: 4.0000 mg | ORAL_TABLET | ORAL | Status: DC | PRN

## 2015-07-01 MED ORDER — SENNOSIDES-DOCUSATE SODIUM 8.6-50 MG PO TABS
2.0000 | ORAL_TABLET | ORAL | Status: DC
  Administered 2015-07-01: 2 via ORAL
  Filled 2015-07-01: qty 2

## 2015-07-01 MED ORDER — FLEET ENEMA 7-19 GM/118ML RE ENEM: 1.0000 | ENEMA | Freq: Every day | RECTAL | Status: DC | PRN

## 2015-07-01 MED ORDER — SIMETHICONE 80 MG PO CHEW: 80.0000 mg | CHEWABLE_TABLET | ORAL | Status: DC | PRN

## 2015-07-01 MED ORDER — OXYCODONE-ACETAMINOPHEN 5-325 MG PO TABS
2.0000 | ORAL_TABLET | ORAL | Status: DC | PRN
  Administered 2015-07-01 – 2015-07-02 (×5): 2 via ORAL
  Filled 2015-07-01 (×5): qty 2

## 2015-07-01 MED ORDER — TETANUS-DIPHTH-ACELL PERTUSSIS 5-2.5-18.5 LF-MCG/0.5 IM SUSP: 0.5000 mL | Freq: Once | INTRAMUSCULAR | Status: DC

## 2015-07-01 MED ORDER — IBUPROFEN 600 MG PO TABS
600.0000 mg | ORAL_TABLET | Freq: Four times a day (QID) | ORAL | Status: DC
  Administered 2015-07-01 – 2015-07-02 (×6): 600 mg via ORAL
  Filled 2015-07-01 (×6): qty 1

## 2015-07-01 MED ORDER — WITCH HAZEL-GLYCERIN EX PADS: 1.0000 "application " | MEDICATED_PAD | CUTANEOUS | Status: DC | PRN

## 2015-07-01 MED ORDER — MEASLES, MUMPS & RUBELLA VAC ~~LOC~~ INJ: 0.5000 mL | INJECTION | Freq: Once | SUBCUTANEOUS | Status: DC

## 2015-07-01 MED ORDER — IBUPROFEN 600 MG PO TABS
600.0000 mg | ORAL_TABLET | Freq: Four times a day (QID) | ORAL | Status: DC
  Administered 2015-07-01: 600 mg via ORAL
  Filled 2015-07-01: qty 1

## 2015-07-01 MED ORDER — DIPHENHYDRAMINE HCL 25 MG PO CAPS: 25.0000 mg | ORAL_CAPSULE | Freq: Four times a day (QID) | ORAL | Status: DC | PRN

## 2015-07-01 MED ORDER — OXYCODONE-ACETAMINOPHEN 5-325 MG PO TABS
1.0000 | ORAL_TABLET | ORAL | Status: DC | PRN
  Administered 2015-07-01 (×2): 1 via ORAL
  Filled 2015-07-01 (×2): qty 1

## 2015-07-01 MED ORDER — ACETAMINOPHEN 325 MG PO TABS: 650.0000 mg | ORAL_TABLET | ORAL | Status: DC | PRN

## 2015-07-01 MED ORDER — ZOLPIDEM TARTRATE 5 MG PO TABS: 5.0000 mg | ORAL_TABLET | Freq: Every evening | ORAL | Status: DC | PRN

## 2015-07-01 MED ORDER — SODIUM CHLORIDE 0.9 % IJ SOLN: 3.0000 mL | Freq: Two times a day (BID) | INTRAMUSCULAR | Status: DC

## 2015-07-01 MED ORDER — SODIUM CHLORIDE 0.9 % IJ SOLN: 3.0000 mL | INTRAMUSCULAR | Status: DC | PRN

## 2015-07-01 MED ORDER — PRENATAL MULTIVITAMIN CH
1.0000 | ORAL_TABLET | Freq: Every day | ORAL | Status: DC
  Administered 2015-07-01 – 2015-07-02 (×2): 1 via ORAL
  Filled 2015-07-01 (×2): qty 1

## 2015-07-01 NOTE — Progress Notes (Signed)
Post Partum Day 1 Subjective: no complaints, up ad lib, voiding and tolerating PO  Objective: Blood pressure 96/54, pulse 88, temperature 98.5 F (36.9 C), temperature source Oral, resp. rate 18, last menstrual period 09/16/2014, SpO2 98 %, unknown if currently breastfeeding.  Physical Exam:  General: alert, cooperative and no distress Lochia: appropriate Uterine Fundus: firm Incision: none DVT Evaluation: No evidence of DVT seen on physical exam.   Recent Labs  06/30/15 0923 07/01/15 0414  HGB 12.2 11.3*  HCT 36.0 33.3*    Assessment/Plan: Plan for discharge tomorrow and Breastfeeding  Continue subutex as prescribed and percocet as needed for pain if motrin not enough.   LOS: 1 day   Patti Shorb 07/01/2015, 6:41 PM

## 2015-07-01 NOTE — Anesthesia Postprocedure Evaluation (Signed)
Anesthesia Post Note  Patient: Diana HumphreyRachel E Richard  Procedure(s) Performed: * No procedures listed *  Patient location during evaluation: Mother Baby Anesthesia Type: Epidural Level of consciousness: awake and alert and oriented Pain management: pain level controlled Vital Signs Assessment: post-procedure vital signs reviewed and stable Respiratory status: spontaneous breathing Cardiovascular status: stable Postop Assessment: no headache and no signs of nausea or vomiting Anesthetic complications: no    Last Vitals:  Filed Vitals:   07/01/15 0042 07/01/15 0331  BP: 129/60 123/60  Pulse: 70 67  Temp: 37.1 C 36.9 C  Resp: 18 18    Last Pain:  Filed Vitals:   07/01/15 0434  PainSc: 4                  Shauni Henner,  Alessandra BevelsJennifer M

## 2015-07-02 MED ORDER — IBUPROFEN 600 MG PO TABS: 600.0000 mg | ORAL_TABLET | Freq: Four times a day (QID) | ORAL | Status: DC | PRN

## 2015-07-02 MED ORDER — NORETHINDRONE 0.35 MG PO TABS: 1.0000 | ORAL_TABLET | Freq: Every day | ORAL | Status: DC

## 2015-07-02 NOTE — Progress Notes (Signed)
D/C order from MD.  Reviewed d/c instructions and prescriptions with patient and answered any questions.  Patient d/c from hospital care but will be staying in room #335 with infant who remains under pediatric care.

## 2015-07-02 NOTE — Discharge Summary (Signed)
Obstetric Discharge Summary Reason for Admission: induction of labor subutex use  Prenatal Procedures: none Intrapartum Procedures: spontaneous vaginal delivery Postpartum Procedures: none Complications-Operative and Postpartum: 2 degree perineal laceration HEMOGLOBIN  Date Value Ref Range Status  07/01/2015 11.3* 12.0 - 16.0 g/dL Final   HGB  Date Value Ref Range Status  03/09/2014 14.1 12.0-16.0 g/dL Final   HCT  Date Value Ref Range Status  07/01/2015 33.3* 35.0 - 47.0 % Final  03/11/2014 33.1* 35.0-47.0 % Final    Physical Exam:  General: alert and cooperative Lochia: appropriate Uterine Fundus: firm Incision: n/a DVT Evaluation: No evidence of DVT seen on physical exam.  Discharge Diagnoses: Term Pregnancy-delivered  Discharge Information: Date: 07/02/2015 Activity: pelvic rest Diet: routine Medications: Ibuprofen Condition: stable Instructions: refer to practice specific booklet Discharge to: home Follow-up Information    Follow up with Karena AddisonSigmon, Meredith C, CNM In 6 weeks.   Specialty:  Certified Nurse Midwife   Why:  postpartum care   Contact information:   [address] [place] [id number] [phone]       Newborn Data: Live born female  Birth Weight: 6 lb 13 oz (3090 g) APGAR: 8, 9  Home with mother.  SCHERMERHORN,THOMAS 07/02/2015, 9:27 AM

## 2015-07-02 NOTE — Discharge Instructions (Signed)
Please call your doctor or return to the ER if you experience any chest pains, shortness of breath, fever greater than 101, any heavy bleeding or large clots, and foul smelling vaginal discharge, any worsening abdominal pain & cramping that is not controlled by pain medication, or any signs of post partum depression.  No tampons, enemas, douches, or sexual intercourse for 6 weeks.  Also avoid tub baths, hot tubs, or swimming for 6 weeks. ° ° ° °Postpartum Care After Vaginal Delivery °After you deliver your newborn (postpartum period), the usual stay in the hospital is 24-72 hours. If there were problems with your labor or delivery, or if you have other medical problems, you might be in the hospital longer.  °While you are in the hospital, you will receive help and instructions on how to care for yourself and your newborn during the postpartum period.  °While you are in the hospital: °· Be sure to tell your nurses if you have pain or discomfort, as well as where you feel the pain and what makes the pain worse. °· If you had an incision made near your vagina (episiotomy) or if you had some tearing during delivery, the nurses may put ice packs on your episiotomy or tear. The ice packs may help to reduce the pain and swelling. °· If you are breastfeeding, you may feel uncomfortable contractions of your uterus for a couple of weeks. This is normal. The contractions help your uterus get back to normal size. °· It is normal to have some bleeding after delivery. °¨ For the first 1-3 days after delivery, the flow is red and the amount may be similar to a period. °¨ It is common for the flow to start and stop. °¨ In the first few days, you may pass some small clots. Let your nurses know if you begin to pass large clots or your flow increases. °¨ Do not  flush blood clots down the toilet before having the nurse look at them. °¨ During the next 3-10 days after delivery, your flow should become more watery and pink or  brown-tinged in color. °¨ Ten to fourteen days after delivery, your flow should be a small amount of yellowish-white discharge. °¨ The amount of your flow will decrease over the first few weeks after delivery. Your flow may stop in 6-8 weeks. Most women have had their flow stop by 12 weeks after delivery. °· You should change your sanitary pads frequently. °· Wash your hands thoroughly with soap and water for at least 20 seconds after changing pads, using the toilet, or before holding or feeding your newborn. °· You should feel like you need to empty your bladder within the first 6-8 hours after delivery. °· In case you become weak, lightheaded, or faint, call your nurse before you get out of bed for the first time and before you take a shower for the first time. °· Within the first few days after delivery, your breasts may begin to feel tender and full. This is called engorgement. Breast tenderness usually goes away within 48-72 hours after engorgement occurs. You may also notice milk leaking from your breasts. If you are not breastfeeding, do not stimulate your breasts. Breast stimulation can make your breasts produce more milk. °· Spending as much time as possible with your newborn is very important. During this time, you and your newborn can feel close and get to know each other. Having your newborn stay in your room (rooming in) will help to strengthen   the bond with your newborn.  It will give you time to get to know your newborn and become comfortable caring for your newborn. °· Your hormones change after delivery. Sometimes the hormone changes can temporarily cause you to feel sad or tearful. These feelings should not last more than a few days. If these feelings last longer than that, you should talk to your caregiver. °· If desired, talk to your caregiver about methods of family planning or contraception. °· Talk to your caregiver about immunizations. Your caregiver may want you to have the following  immunizations before leaving the hospital: °¨ Tetanus, diphtheria, and pertussis (Tdap) or tetanus and diphtheria (Td) immunization. It is very important that you and your family (including grandparents) or others caring for your newborn are up-to-date with the Tdap or Td immunizations. The Tdap or Td immunization can help protect your newborn from getting ill. °¨ Rubella immunization. °¨ Varicella (chickenpox) immunization. °¨ Influenza immunization. You should receive this annual immunization if you did not receive the immunization during your pregnancy. °  °This information is not intended to replace advice given to you by your health care provider. Make sure you discuss any questions you have with your health care provider. °  °Document Released: 05/01/2007 Document Revised: 03/28/2012 Document Reviewed: 02/29/2012 °Elsevier Interactive Patient Education ©2016 [employer] ° °

## 2015-07-03 LAB — SURGICAL PATHOLOGY

## 2016-01-20 IMAGING — US US OB COMP LESS 14 WK
1 series · 14 of 28 positions shown · non-contrast
Comparison: none

[Series 1: us ob comp less 14 wk · 0.20mm/px · 14 of 47 slices shown]
[im 2/47]
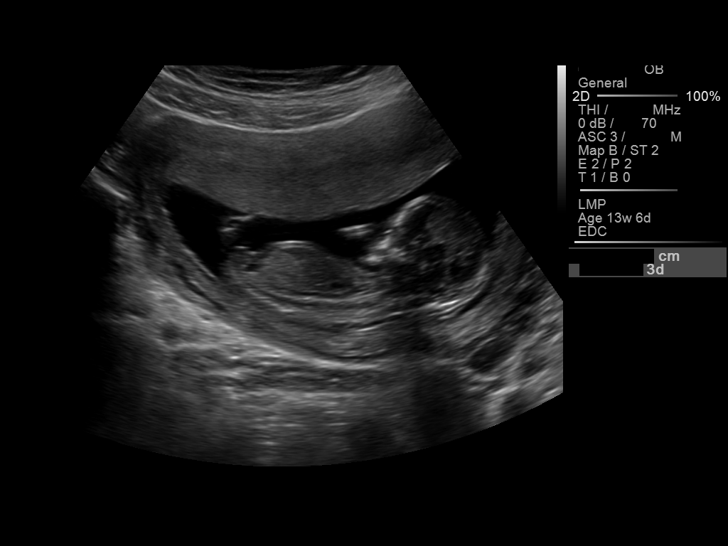
[im 6/47]
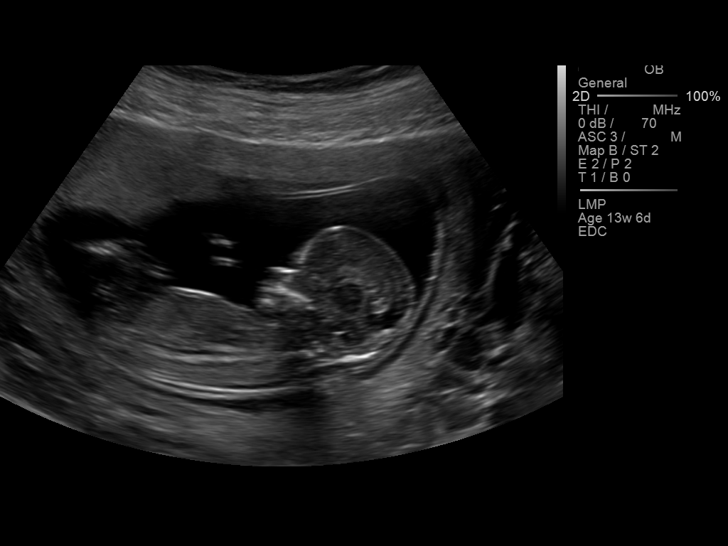
[im 9/47]
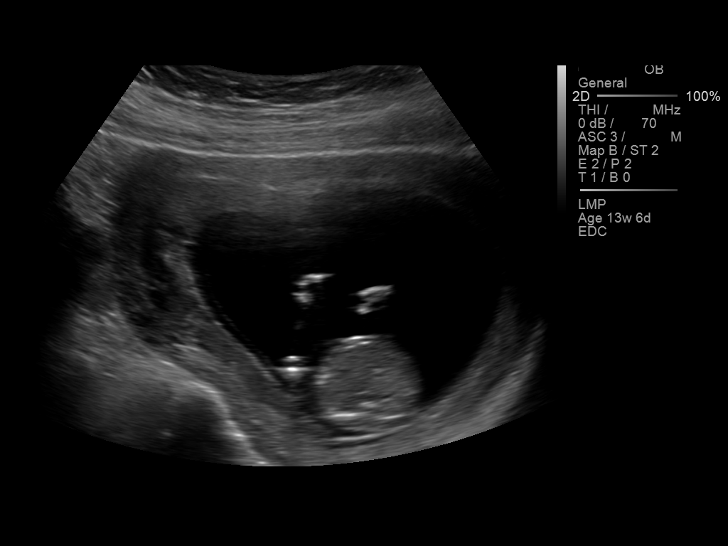
[im 12/47]
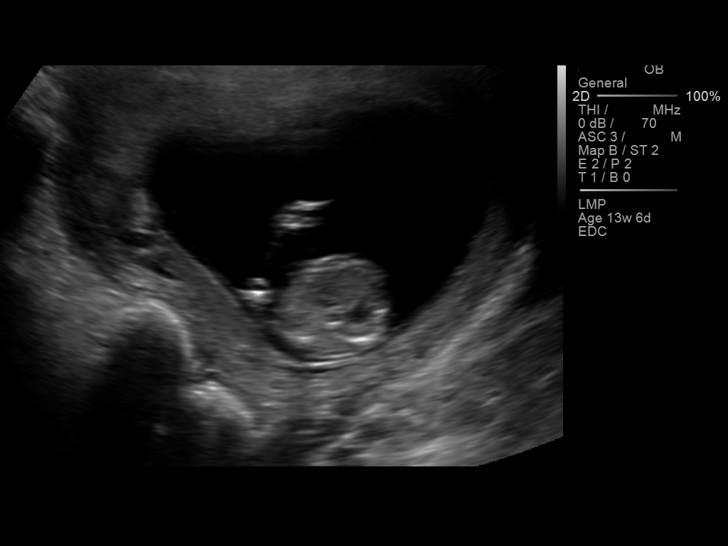
[im 16/47]
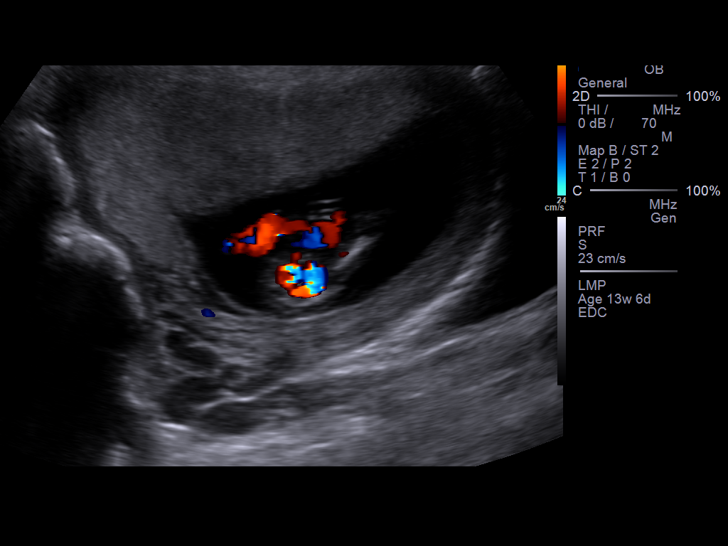
[im 19/47]
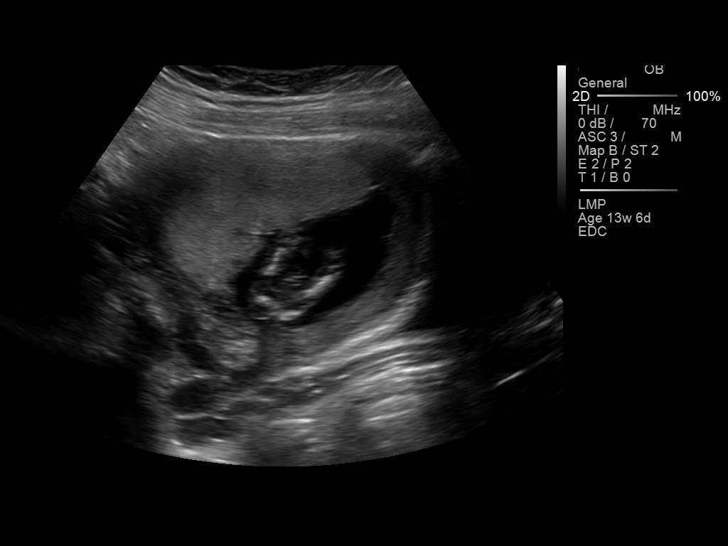
[im 23/47]
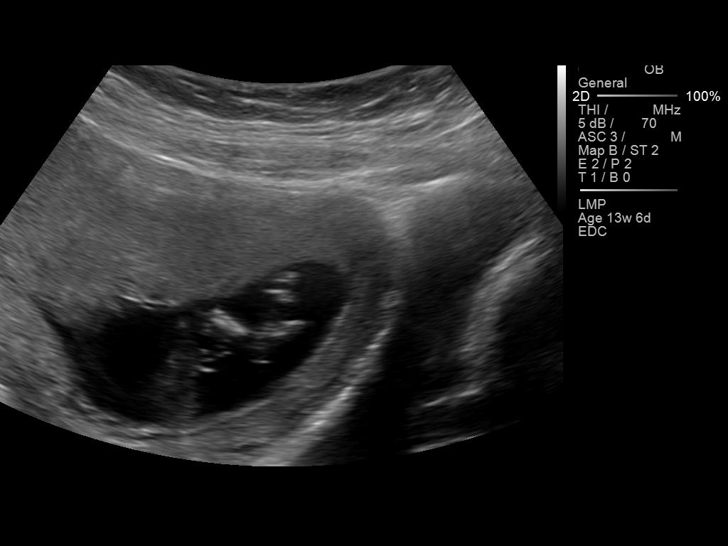
[im 26/47]
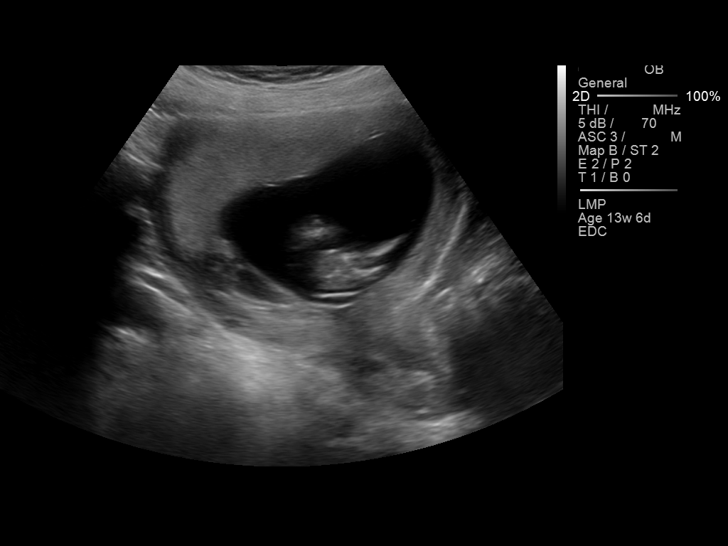
[im 29/47]
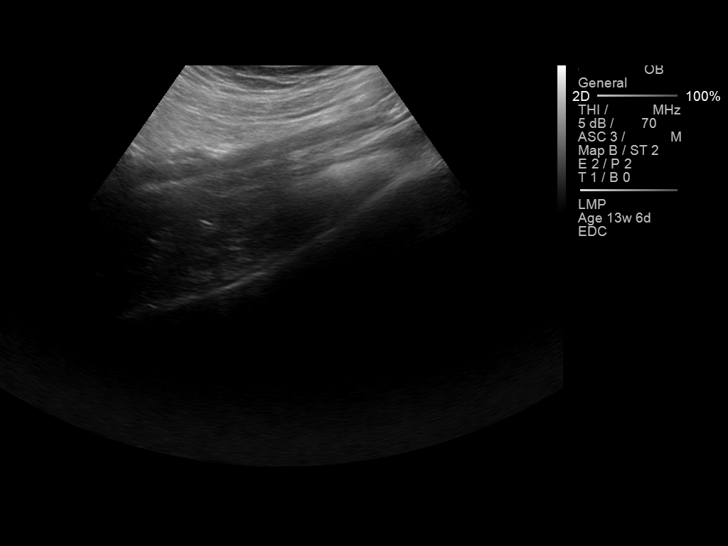
[im 33/47]
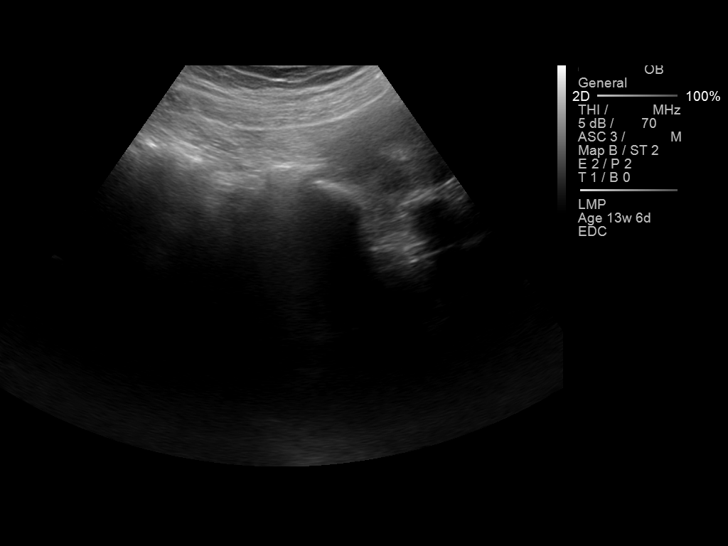
[im 36/47]
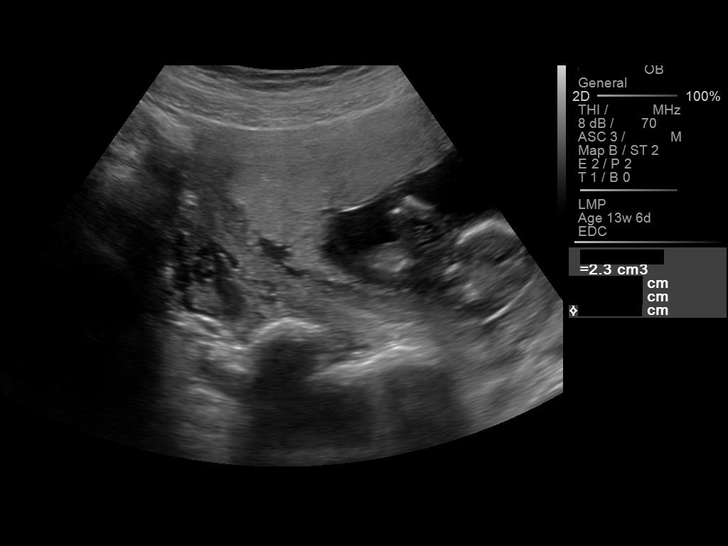
[im 40/47]
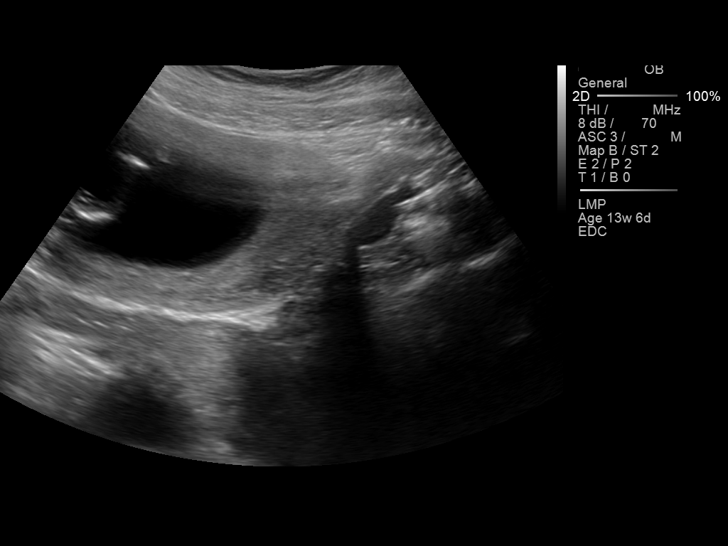
[im 43/47]
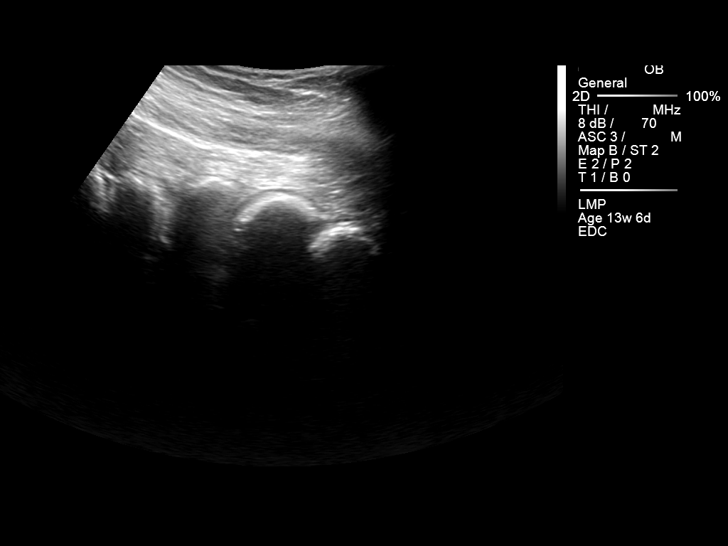
[im 47/47]
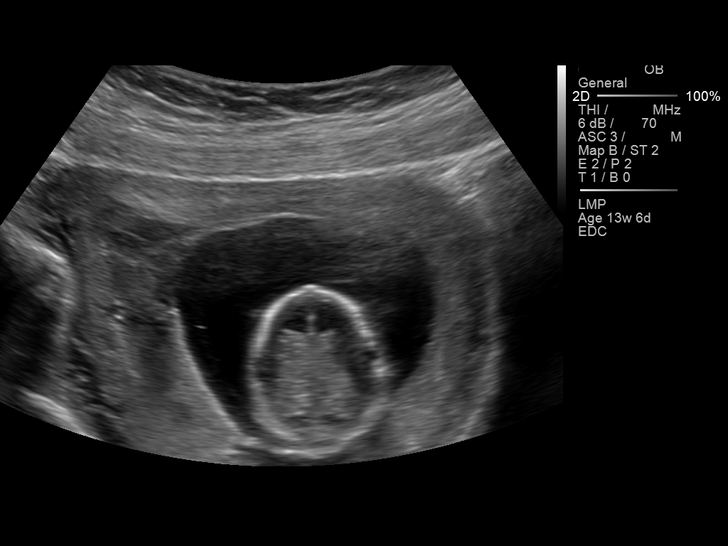

[14 of 28 positions shown; findings below may reference images not displayed]

Canned report from images found in remote index.

Refer to host system for actual result text.

## 2016-05-05 ENCOUNTER — Encounter: Payer: Self-pay | Admitting: *Deleted

## 2016-05-05 ENCOUNTER — Ambulatory Visit
Admission: RE | Admit: 2016-05-05 | Discharge: 2016-05-05 | Disposition: A | Discharge status: Home / Self Care | Payer: Medicaid Other | Source: Ambulatory Visit | Attending: Maternal & Fetal Medicine | Admitting: Maternal & Fetal Medicine

## 2016-05-05 DIAGNOSIS — Z87898 Personal history of other specified conditions: Secondary | ICD-10-CM | POA: Diagnosis not present

## 2016-05-05 DIAGNOSIS — Z3492 Encounter for supervision of normal pregnancy, unspecified, second trimester: Secondary | ICD-10-CM | POA: Insufficient documentation

## 2016-05-05 DIAGNOSIS — Z3A17 17 weeks gestation of pregnancy: Secondary | ICD-10-CM | POA: Diagnosis present

## 2016-05-12 ENCOUNTER — Other Ambulatory Visit: Payer: Medicaid Other

## 2016-05-30 ENCOUNTER — Other Ambulatory Visit: Payer: Self-pay | Admitting: *Deleted

## 2016-05-30 DIAGNOSIS — F1991 Other psychoactive substance use, unspecified, in remission: Secondary | ICD-10-CM

## 2016-05-30 DIAGNOSIS — Z87898 Personal history of other specified conditions: Secondary | ICD-10-CM

## 2016-06-02 ENCOUNTER — Inpatient Hospital Stay: Admission: RE | Admit: 2016-06-02 | Payer: Medicaid Other | Source: Ambulatory Visit

## 2016-10-04 ENCOUNTER — Inpatient Hospital Stay: Payer: Medicaid Other | Admitting: Anesthesiology

## 2016-10-04 ENCOUNTER — Inpatient Hospital Stay
Admission: EM | Admit: 2016-10-04 | Discharge: 2016-10-07 | DRG: 765 | Disposition: A | Discharge status: Home / Self Care | Payer: Medicaid Other | Attending: Obstetrics and Gynecology | Admitting: Obstetrics and Gynecology

## 2016-10-04 ENCOUNTER — Encounter
Admission: EM | Disposition: A | Discharge status: Home / Self Care | Payer: Self-pay | Source: Home / Self Care | Attending: Obstetrics and Gynecology

## 2016-10-04 DIAGNOSIS — O99324 Drug use complicating childbirth: Secondary | ICD-10-CM | POA: Diagnosis present

## 2016-10-04 DIAGNOSIS — O365990 Maternal care for other known or suspected poor fetal growth, unspecified trimester, not applicable or unspecified: Secondary | ICD-10-CM | POA: Diagnosis present

## 2016-10-04 DIAGNOSIS — O4103X0 Oligohydramnios, third trimester, not applicable or unspecified: Secondary | ICD-10-CM | POA: Diagnosis present

## 2016-10-04 DIAGNOSIS — F1120 Opioid dependence, uncomplicated: Secondary | ICD-10-CM | POA: Diagnosis present

## 2016-10-04 DIAGNOSIS — F17210 Nicotine dependence, cigarettes, uncomplicated: Secondary | ICD-10-CM | POA: Diagnosis present

## 2016-10-04 DIAGNOSIS — O322XX0 Maternal care for transverse and oblique lie, not applicable or unspecified: Secondary | ICD-10-CM | POA: Diagnosis present

## 2016-10-04 DIAGNOSIS — O365930 Maternal care for other known or suspected poor fetal growth, third trimester, not applicable or unspecified: Principal | ICD-10-CM | POA: Diagnosis present

## 2016-10-04 DIAGNOSIS — Z9889 Other specified postprocedural states: Secondary | ICD-10-CM

## 2016-10-04 DIAGNOSIS — O99334 Smoking (tobacco) complicating childbirth: Secondary | ICD-10-CM | POA: Diagnosis present

## 2016-10-04 DIAGNOSIS — Z3A39 39 weeks gestation of pregnancy: Secondary | ICD-10-CM

## 2016-10-04 LAB — URINE DRUG SCREEN, QUALITATIVE (ARMC ONLY)
Amphetamines, Ur Screen: NOT DETECTED
BARBITURATES, UR SCREEN: NOT DETECTED
BENZODIAZEPINE, UR SCRN: NOT DETECTED
CANNABINOID 50 NG, UR ~~LOC~~: NOT DETECTED
Cocaine Metabolite,Ur ~~LOC~~: NOT DETECTED
MDMA (Ecstasy)Ur Screen: NOT DETECTED
Methadone Scn, Ur: NOT DETECTED
OPIATE, UR SCREEN: NOT DETECTED
PHENCYCLIDINE (PCP) UR S: NOT DETECTED
Tricyclic, Ur Screen: NOT DETECTED

## 2016-10-04 LAB — TYPE AND SCREEN
ABO/RH(D): A POS
ANTIBODY SCREEN: NEGATIVE

## 2016-10-04 LAB — COMPREHENSIVE METABOLIC PANEL
ALK PHOS: 102 U/L (ref 38–126)
ALT: 10 U/L — AB (ref 14–54)
ANION GAP: 8 (ref 5–15)
AST: 18 U/L (ref 15–41)
Albumin: 3.2 g/dL — ABNORMAL LOW (ref 3.5–5.0)
BUN: 6 mg/dL (ref 6–20)
CALCIUM: 8.6 mg/dL — AB (ref 8.9–10.3)
CO2: 20 mmol/L — ABNORMAL LOW (ref 22–32)
CREATININE: 0.46 mg/dL (ref 0.44–1.00)
Chloride: 106 mmol/L (ref 101–111)
GFR calc non Af Amer: >60 mL/min (ref >60)
Glucose, Bld: 80 mg/dL (ref 65–99)
Potassium: 3.7 mmol/L (ref 3.5–5.1)
Sodium: 134 mmol/L — ABNORMAL LOW (ref 135–145)
TOTAL PROTEIN: 6.6 g/dL (ref 6.5–8.1)
Total Bilirubin: 0.3 mg/dL (ref 0.3–1.2)

## 2016-10-04 LAB — CBC
HCT: 36 % (ref 35.0–47.0)
HEMOGLOBIN: 12.6 g/dL (ref 12.0–16.0)
MCH: 31.5 pg (ref 26.0–34.0)
MCHC: 35 g/dL (ref 32.0–36.0)
MCV: 89.9 fL (ref 80.0–100.0)
Platelets: 232 10*3/uL (ref 150–440)
RBC: 4.01 MIL/uL (ref 3.80–5.20)
RDW: 13.2 % (ref 11.5–14.5)
WBC: 10.6 10*3/uL (ref 3.6–11.0)

## 2016-10-04 LAB — CHLAMYDIA/NGC RT PCR (ARMC ONLY)
Chlamydia Tr: NOT DETECTED
N gonorrhoeae: NOT DETECTED

## 2016-10-04 LAB — RAPID HIV SCREEN (HIV 1/2 AB+AG)
HIV 1/2 ANTIBODIES: NONREACTIVE
HIV-1 P24 ANTIGEN - HIV24: NONREACTIVE

## 2016-10-04 SURGERY — Surgical Case
Anesthesia: Spinal | Site: Abdomen | Wound class: Clean Contaminated

## 2016-10-04 MED ORDER — MORPHINE SULFATE (PF) 4 MG/ML IV SOLN
2.0000 mg | INTRAVENOUS | Status: DC | PRN
  Administered 2016-10-05 (×2): 2 mg via INTRAVENOUS
  Filled 2016-10-04 (×2): qty 1

## 2016-10-04 MED ORDER — FENTANYL CITRATE (PF) 100 MCG/2ML IJ SOLN
INTRAMUSCULAR | Status: DC | PRN
  Administered 2016-10-04: 20 ug via INTRATHECAL

## 2016-10-04 MED ORDER — LACTATED RINGERS IV SOLN: 500.0000 mL | INTRAVENOUS | Status: DC | PRN

## 2016-10-04 MED ORDER — KETOROLAC TROMETHAMINE 30 MG/ML IJ SOLN
30.0000 mg | Freq: Three times a day (TID) | INTRAMUSCULAR | Status: DC | PRN
  Administered 2016-10-05 (×3): 30 mg via INTRAVENOUS
  Filled 2016-10-04 (×3): qty 1

## 2016-10-04 MED ORDER — OXYTOCIN 40 UNITS IN LACTATED RINGERS INFUSION - SIMPLE MED
INTRAVENOUS | Status: DC | PRN
Start: 2016-10-04 — End: 2016-10-04
  Administered 2016-10-04: 299 mL via INTRAVENOUS
  Administered 2016-10-04: 1 mL via INTRAVENOUS

## 2016-10-04 MED ORDER — LACTATED RINGERS IV SOLN
INTRAVENOUS | Status: DC
  Administered 2016-10-04: 22:00:00 via INTRAVENOUS

## 2016-10-04 MED ORDER — LACTATED RINGERS IV SOLN: INTRAVENOUS | Status: DC

## 2016-10-04 MED ORDER — ACETAMINOPHEN 325 MG PO TABS: 650.0000 mg | ORAL_TABLET | ORAL | Status: DC | PRN

## 2016-10-04 MED ORDER — SODIUM CHLORIDE 0.9 % IJ SOLN
INTRAMUSCULAR | Status: AC
  Filled 2016-10-04: qty 50

## 2016-10-04 MED ORDER — BUPRENORPHINE HCL 8 MG SL SUBL
8.0000 mg | SUBLINGUAL_TABLET | Freq: Two times a day (BID) | SUBLINGUAL | Status: DC
  Administered 2016-10-05 – 2016-10-07 (×5): 8 mg via SUBLINGUAL
  Filled 2016-10-04 (×5): qty 1

## 2016-10-04 MED ORDER — BUPIVACAINE LIPOSOME 1.3 % IJ SUSP
20.0000 mL | Freq: Once | INTRAMUSCULAR | Status: DC
  Filled 2016-10-04: qty 20

## 2016-10-04 MED ORDER — FENTANYL CITRATE (PF) 100 MCG/2ML IJ SOLN
INTRAMUSCULAR | Status: AC
  Filled 2016-10-04: qty 2

## 2016-10-04 MED ORDER — BUPIVACAINE HCL (PF) 0.5 % IJ SOLN
INTRAMUSCULAR | Status: DC | PRN
  Administered 2016-10-04: 90 mL

## 2016-10-04 MED ORDER — PROPOFOL 10 MG/ML IV BOLUS
INTRAVENOUS | Status: AC
  Filled 2016-10-04: qty 20

## 2016-10-04 MED ORDER — BUPIVACAINE HCL (PF) 0.5 % IJ SOLN
INTRAMUSCULAR | Status: AC
  Filled 2016-10-04: qty 30

## 2016-10-04 MED ORDER — ALUM & MAG HYDROXIDE-SIMETH 200-200-20 MG/5ML PO SUSP: 15.0000 mL | ORAL | Status: DC | PRN

## 2016-10-04 MED ORDER — BUPIVACAINE LIPOSOME 1.3 % IJ SUSP
INTRAMUSCULAR | Status: AC
Start: 2016-10-04 — End: 2016-10-04
  Filled 2016-10-04: qty 20

## 2016-10-04 MED ORDER — ONDANSETRON HCL 4 MG/2ML IJ SOLN
INTRAMUSCULAR | Status: DC | PRN
  Administered 2016-10-04: 4 mg via INTRAVENOUS

## 2016-10-04 MED ORDER — ATROPINE SULFATE 0.4 MG/ML IV SOSY
PREFILLED_SYRINGE | INTRAVENOUS | Status: AC
  Filled 2016-10-04: qty 2.5

## 2016-10-04 MED ORDER — PHENYLEPHRINE HCL 10 MG/ML IJ SOLN
INTRAMUSCULAR | Status: AC
  Filled 2016-10-04: qty 1

## 2016-10-04 MED ORDER — LIDOCAINE HCL (PF) 1 % IJ SOLN: 30.0000 mL | INTRAMUSCULAR | Status: DC | PRN

## 2016-10-04 MED ORDER — BUPIVACAINE IN DEXTROSE 0.75-8.25 % IT SOLN
INTRATHECAL | Status: DC | PRN
  Administered 2016-10-04: 1.8 mL via INTRATHECAL

## 2016-10-04 MED ORDER — CEFAZOLIN SODIUM-DEXTROSE 2-3 GM-% IV SOLR
2.0000 g | Freq: Once | INTRAVENOUS | Status: AC
  Administered 2016-10-04: 2 g via INTRAVENOUS
  Filled 2016-10-04: qty 50

## 2016-10-04 MED ORDER — LACTATED RINGERS IV BOLUS (SEPSIS)
1000.0000 mL | Freq: Once | INTRAVENOUS | Status: AC
  Administered 2016-10-04: 1000 mL via INTRAVENOUS

## 2016-10-04 MED ORDER — ACETAMINOPHEN 500 MG PO TABS
1000.0000 mg | ORAL_TABLET | Freq: Four times a day (QID) | ORAL | Status: AC
  Administered 2016-10-05 – 2016-10-06 (×7): 1000 mg via ORAL
  Filled 2016-10-04 (×7): qty 2

## 2016-10-04 MED ORDER — OXYTOCIN BOLUS FROM INFUSION: 500.0000 mL | Freq: Once | INTRAVENOUS | Status: DC

## 2016-10-04 MED ORDER — ONDANSETRON HCL 4 MG/2ML IJ SOLN
INTRAMUSCULAR | Status: AC
  Filled 2016-10-04: qty 2

## 2016-10-04 MED ORDER — CEFAZOLIN SODIUM-DEXTROSE 2-4 GM/100ML-% IV SOLN
2.0000 g | INTRAVENOUS | Status: DC
  Filled 2016-10-04: qty 100

## 2016-10-04 MED ORDER — SOD CITRATE-CITRIC ACID 500-334 MG/5ML PO SOLN
30.0000 mL | ORAL | Status: AC
  Administered 2016-10-04: 30 mL via ORAL
  Filled 2016-10-04: qty 15

## 2016-10-04 MED ORDER — ONDANSETRON HCL 4 MG/2ML IJ SOLN: 4.0000 mg | Freq: Four times a day (QID) | INTRAMUSCULAR | Status: DC | PRN

## 2016-10-04 MED ORDER — OXYTOCIN 40 UNITS IN LACTATED RINGERS INFUSION - SIMPLE MED: 2.5000 [IU]/h | INTRAVENOUS | Status: DC

## 2016-10-04 MED ORDER — SOD CITRATE-CITRIC ACID 500-334 MG/5ML PO SOLN
30.0000 mL | ORAL | Status: DC | PRN
Start: 2016-10-04 — End: 2016-10-05

## 2016-10-04 SURGICAL SUPPLY — 26 items
BARRIER ADHS 3X4 INTERCEED (GAUZE/BANDAGES/DRESSINGS) ×3 IMPLANT
CANISTER SUCT 3000ML (MISCELLANEOUS) ×3 IMPLANT
CATH KIT ON-Q SILVERSOAK 5IN (CATHETERS) IMPLANT
CHLORAPREP W/TINT 26ML (MISCELLANEOUS) ×6 IMPLANT
DRSG OPSITE POSTOP 4X10 (GAUZE/BANDAGES/DRESSINGS) ×3 IMPLANT
DRSG TELFA 3X8 NADH (GAUZE/BANDAGES/DRESSINGS) IMPLANT
ELECT CAUTERY BLADE 6.4 (BLADE) ×6 IMPLANT
ELECT REM PT RETURN 9FT ADLT (ELECTROSURGICAL) ×3
ELECTRODE REM PT RTRN 9FT ADLT (ELECTROSURGICAL) ×1 IMPLANT
GAUZE SPONGE 4X4 12PLY STRL (GAUZE/BANDAGES/DRESSINGS) IMPLANT
GLOVE BIO SURGEON STRL SZ8 (GLOVE) ×18 IMPLANT
GOWN STRL REUS W/ TWL LRG LVL3 (GOWN DISPOSABLE) ×2 IMPLANT
GOWN STRL REUS W/ TWL XL LVL3 (GOWN DISPOSABLE) ×1 IMPLANT
GOWN STRL REUS W/TWL LRG LVL3 (GOWN DISPOSABLE) ×4
GOWN STRL REUS W/TWL XL LVL3 (GOWN DISPOSABLE) ×2
NDL SAFETY 22GX1.5 (NEEDLE) ×9 IMPLANT
NS IRRIG 1000ML POUR BTL (IV SOLUTION) ×3 IMPLANT
PACK C SECTION AR (MISCELLANEOUS) ×3 IMPLANT
PAD OB MATERNITY 4.3X12.25 (PERSONAL CARE ITEMS) ×6 IMPLANT
PAD PREP 24X41 OB/GYN DISP (PERSONAL CARE ITEMS) IMPLANT
STAPLER INSORB 30 2030 C-SECTI (MISCELLANEOUS) ×3 IMPLANT
STRAP SAFETY BODY (MISCELLANEOUS) ×3 IMPLANT
SUT CHROMIC 1 CTX 36 (SUTURE) ×9 IMPLANT
SUT PLAIN GUT 0 (SUTURE) ×6 IMPLANT
SUT VIC AB 0 CT1 36 (SUTURE) ×6 IMPLANT
SYR 30ML LL (SYRINGE) ×9 IMPLANT

## 2016-10-04 NOTE — Op Note (Signed)
Diana Richard, Diana Richard            ACCOUNT NO.:  [id number]  MEDICAL RECORD NO.:  [id number]  LOCATION:  ED                           FACILITY:  [facility]  PHYSICIAN:  Jennell Corner, MDDATE OF BIRTH:  15-Jul-1991  DATE OF PROCEDURE:10/04/2016 DATE OF DISCHARGE:                              OPERATIVE REPORT   PREOPERATIVE DIAGNOSIS: 1. A 39-plus-2 weeks' estimated gestational age. 2. Asymmetric intrauterine growth restriction. 3. Oligohydramnios. 4. Narcotic addiction, on Subutex. 5. Transverse lie.  POSTOPERATIVE DIAGNOSIS: 1. A 39-plus-2 weeks' estimated gestational age. 2. Asymmetric intrauterine growth restriction. 3. Oligohydramnios. 4. Narcotic addiction, on Subutex. 5. Transverse lie.  PROCEDURE PERFORMED:  Primary low transverse cesarean section.  SURGEON:  Jennell Corner, MD  ANESTHESIA:  Spinal.  SURGEON:  Jennell Corner, MD.  FIRST ASSISTANT:  Scrub tech.  INDICATIONS:  This 26 year old, gravida 3, para 2 patient with no prenatal care presented to [facility] on the day of the procedure. Ultrasound was performed that showed the fetal growth was 4%.  Also complicated by oligohydramnios with AFI of 5.6.  The patient was known to be narcotic addicted patient on Subutex.  Given the patient's unreliable prenatal care history and evidence of intrauterine growth restriction and transverse lie, it was decided that the patient undergo primary low transverse cesarean section.  DESCRIPTION OF PROCEDURE:  After adequate spinal anesthesia, the patient was placed in dorsal supine position.  The patient was prepped and draped in a normal sterile fashion.  The patient did receive 2 g of IV Ancef prior to commencement of the case.  A time-out was performed.  A Pfannenstiel incision was made 2 fingerbreadths above the symphysis pubis.  Sharp dissection was used to identify the fascia.  Fascia was opened in midline in a transverse fashion.  Superior aspect of  the fascia was grasped with Kocher clamps and the recti muscles were dissected free.  Inferior aspect of the fascia was grasped with Kocher clamps and the pyramidalis muscle was dissected free.  Peritoneal cavity was opened sharply.  The vesicular uterine peritoneal fold was identified at the lower uterine segment.  Bladder flap was created, and the bladder was reflected inferiorly.  A low transverse uterine incision was made.  Clear fluid resulted once entering the amniotic cavity.  The uterine incision was extended with blunt transverse traction.  Fetal feet were identified at the lower uterine segment.  Feet, buttocks, and each arm was delivered with a sweeping motion, and the head was delivered without difficulty.  A small but vigorous female was dried on the abdomen for 60 seconds.  Apgar scores were assigned 9 and 9.  Weight 5 pounds 7 ounces.  The placenta was manually delivered, and the uterus was exteriorized.  The endometrial cavity was wiped clean with laparotomy tape, and the cervix was opened with a ring forceps and passed off the operative field.  The uterine incision was closed with a running 1 chromic suture in a running locking fashion.  Good approximation of edges.  Good hemostasis noted.  Fallopian tubes and ovaries appeared normal.  Posterior cul-de-sac was irrigated and suctioned.  Uterus was placed back into the abdominal cavity, and the pericolic gutters were wiped clean with a laparotomy tape.  Uterine incision again appeared hemostatic.  Interceed was placed over the uterine incision in a T-shaped fashion.  Fascia was then closed with a 0 Vicryl suture in a running nonlocking fashion with good approximation of edges.  The fascia was then injected with a solution of 20 mL of 1.3% bupivacaine with 30 mL of 0.5% Marcaine with 50 mL of injectable normal saline.  This solution was injected, 30 mL, on both sides on the fascial edge in a fan-like fashion.  Subcutaneous  tissues were irrigated and bovied for hemostasis, and the skin was reapproximated with Insorb absorbable staples.  Good cosmetic effect.  The skin was then subcuticularly injected with the bupivacaine, Marcaine saline solution as well.  There were no complications.  The patient tolerated the procedure well.  ESTIMATED BLOOD LOSS:  400 mL.  INTRAOPERATIVE FLUIDS:  900 mL.  URINE OUTPUT:  175 mL.  The patient was taken to recovery room in a good condition.          ______________________________ Jennell Cornerhomas Aristotelis Vilardi, MD     TS/MEDQ  D:  10/04/2016  T:  10/04/2016  Job:  [id number]

## 2016-10-04 NOTE — Transfer of Care (Signed)
Immediate Anesthesia Transfer of Care Note  Patient: Diana HumphreyRachel E Courtney  Procedure(s) Performed: Procedure(s): CESAREAN SECTION (N/A)  Patient Location: PACU  Anesthesia Type:Spinal  Level of Consciousness: awake, alert , oriented and patient cooperative  Airway & Oxygen Therapy: Patient Spontanous Breathing and Patient connected to nasal cannula oxygen  Post-op Assessment: Report given to RN and Post -op Vital signs reviewed and stable  Post vital signs: Reviewed and stable  Last Vitals:  Vitals:   10/04/16 2018 10/04/16 2240  BP: 125/68 (!) 107/54  Pulse: 83 64  Resp: 16 12  Temp: 36.8 C 36.6 C    Last Pain:  Vitals:   10/04/16 2240  TempSrc: Oral  PainSc:          Complications: No apparent anesthesia complications

## 2016-10-04 NOTE — Anesthesia Preprocedure Evaluation (Addendum)
Anesthesia Evaluation  Patient identified by MRN, date of birth, ID band Patient awake    Reviewed: Allergy & Precautions, H&P , NPO status , Patient's Chart, lab work & pertinent test results, reviewed documented beta blocker date and time   Airway Mallampati: II  TM Distance: >3 FB Neck ROM: full    Dental no notable dental hx. (+) Teeth Intact   Pulmonary neg pulmonary ROS, Current Smoker,    Pulmonary exam normal breath sounds clear to auscultation       Cardiovascular Exercise Tolerance: Good negative cardio ROS   Rhythm:regular Rate:Normal     Neuro/Psych negative neurological ROS  negative psych ROS   GI/Hepatic negative GI ROS, Neg liver ROS,   Endo/Other  negative endocrine ROSdiabetes  Renal/GU      Musculoskeletal   Abdominal   Peds  Hematology negative hematology ROS (+)   Anesthesia Other Findings   Reproductive/Obstetrics (+) Pregnancy                             Anesthesia Physical Anesthesia Plan  ASA: II  Anesthesia Plan: Epidural   Post-op Pain Management:    Induction:   Airway Management Planned:   Additional Equipment:   Intra-op Plan:   Post-operative Plan:   Informed Consent: I have reviewed the patients History and Physical, chart, labs and discussed the procedure including the risks, benefits and alternatives for the proposed anesthesia with the patient or authorized representative who has indicated his/her understanding and acceptance.     Plan Discussed with:   Anesthesia Plan Comments:         Anesthesia Quick Evaluation  

## 2016-10-04 NOTE — H&P (Signed)
Revonda HumphreyRachel E Maiers is a 26 y.o. female presenting for IUGR and Oligohydramnios for this 26 yo .G3P2 . No PNC . 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 Denies recent illicit drug use. U/S showed fetus at 2707 gm = 4% efw and AFI 5.7cm . Fetal lie : Transverse .Dilated bowel seen on u/s today  DAting is based [facility] 17+[facility] OB History    Gravida Para Term Preterm AB Living   3 2 1     2    SAB TAB Ectopic Multiple Live Births         0       Past Medical History:  Diagnosis Date  . Anxiety   . Depression   narcotic addiction  Past Surgical History:  Procedure Laterality Date  . LUMBAR LAMINECTOMY/DECOMPRESSION MICRODISCECTOMY Left 10/11/2012   Procedure: LUMBAR LAMINECTOMY/DECOMPRESSION MICRODISCECTOMY 1 LEVEL;  Surgeon: Tia Alertavid S Jones, MD;  Location: [facility];  Service: Neurosurgery;  Laterality: Left;  left lumbar four-five  . LUMBAR LAMINECTOMY/DECOMPRESSION MICRODISCECTOMY Right 06/07/2013   Procedure: Right Lumbar five-sacral one microdiskectomy ;  Surgeon: Tia Alertavid S Jones, MD;  Location: [facility];  Service: Neurosurgery;  Laterality: Right;  Right Lumbar five-sacral one microdiskectomy   . TONSILLECTOMY     Family History: family history is not on file. Social History:  reports that she has been smoking Cigarettes.  She has a 1.50 pack-year smoking history. She has never used smokeless tobacco. She reports that she does not drink alcohol or use drugs.. + NArcotic addiction  No prenatal testing No PNC      ROS History   unknown if currently breastfeeding. Exam Physical Exam  Lungs CTA  CV RRR  ABD soft NT  EFM : reassuring  Prenatal labs: ABO, Rh: --/--/PENDING (03/20 1856) A+ Antibody: PENDING (03/20 1856) Rubella:  pending  RPR:   pending  HBsAg:   pending  HIV:   pending  GBS:   not done  Assessment/Plan: Poor Prenatal care  Narcotic addiction on subutex  Oligo hydramnios and IUGR 4% asymmetric  Transverse lie  Recommend  delivery via c/s tonight given high risk for fetus in the in utero environment and her history of no pnc  I explained the reasoning for the procedure and the indication . The pt and FOB understand are agree to proceed . All questions answered .   Cecely Rengel 10/04/2016, 8:05 PM

## 2016-10-04 NOTE — Brief Op Note (Signed)
10/04/2016 Date of procedure 10/04/16 10:27 PM  PATIENT:  Diana Richard  26 y.o. female  PRE-OPERATIVE DIAGNOSIS:  transverse lie. low afl Asymmetric IUGR,  POST-OPERATIVE DIAGNOSIS: same  PROCEDURE:  Procedure(s): CESAREAN SECTION (N/A) LTCS SURGEON:  Surgeon(s) and Role:    Suzy Bouchard* Loretta Doutt J Eleina Jergens, MD - Primary  PHYSICIAN ASSISTANT: -scrub tech   ASSISTANTS: none   ANESTHESIA:   spinal  EBL:  Total I/O In: -  Out: 575 [Urine:175; Blood:400]  BLOOD ADMINISTERED:none  DRAINS: Urinary Catheter (Foley)   LOCAL MEDICATIONS USED:  OTHER exparel / marcaine/ ns solution 90 cc injected fascial and subcuticular   SPECIMEN:  Source of Specimen:  placenta  DISPOSITION OF SPECIMEN:  PATHOLOGY  COUNTS:  YES  TOURNIQUET:  * No tourniquets in log *  DICTATION: .Other Dictation: Dictation Number verbal  PLAN OF CARE: Admit to inpatient   PATIENT DISPOSITION:  PACU - hemodynamically stable.   Delay start of Pharmacological VTE agent (>24hrs) due to surgical blood loss or risk of bleeding: not applicable

## 2016-10-04 NOTE — Anesthesia Procedure Notes (Signed)
Spinal  Patient location during procedure: OB Start time: 10/04/2016 9:45 PM Staffing Performed: anesthesiologist  Preanesthetic Checklist Completed: patient identified, site marked, surgical consent, pre-op evaluation, timeout performed, IV checked, risks and benefits discussed and monitors and equipment checked Spinal Block Patient position: sitting Prep: Betadine Patient monitoring: heart rate, cardiac monitor, continuous pulse ox and blood pressure Location: L3-4 Injection technique: single-shot Needle Needle type: Whitacre  Needle gauge: 25 G Needle length: 9 cm Assessment Sensory level: T4 Additional Notes SAB placed without difficulty.  +CSF No Heme.  No paresthesia.  VSST and tolerated well.  JA

## 2016-10-04 NOTE — Anesthesia Post-op Follow-up Note (Cosign Needed)
Anesthesia QCDR form completed.        

## 2016-10-05 ENCOUNTER — Encounter: Payer: Self-pay | Admitting: Obstetrics and Gynecology

## 2016-10-05 DIAGNOSIS — Z9889 Other specified postprocedural states: Secondary | ICD-10-CM

## 2016-10-05 DIAGNOSIS — O322XX0 Maternal care for transverse and oblique lie, not applicable or unspecified: Secondary | ICD-10-CM

## 2016-10-05 DIAGNOSIS — O365990 Maternal care for other known or suspected poor fetal growth, unspecified trimester, not applicable or unspecified: Secondary | ICD-10-CM

## 2016-10-05 LAB — CBC
HEMATOCRIT: 33.8 % — AB (ref 35.0–47.0)
Hemoglobin: 11.6 g/dL — ABNORMAL LOW (ref 12.0–16.0)
MCH: 31.3 pg (ref 26.0–34.0)
MCHC: 34.4 g/dL (ref 32.0–36.0)
MCV: 90.9 fL (ref 80.0–100.0)
Platelets: 181 10*3/uL (ref 150–440)
RBC: 3.72 MIL/uL — AB (ref 3.80–5.20)
RDW: 13.3 % (ref 11.5–14.5)
WBC: 12 10*3/uL — ABNORMAL HIGH (ref 3.6–11.0)

## 2016-10-05 MED ORDER — PRENATAL MULTIVITAMIN CH
1.0000 | ORAL_TABLET | Freq: Every day | ORAL | Status: DC
  Administered 2016-10-05 – 2016-10-06 (×2): 1 via ORAL
  Filled 2016-10-05 (×2): qty 1

## 2016-10-05 MED ORDER — DIPHENHYDRAMINE HCL 25 MG PO CAPS: 25.0000 mg | ORAL_CAPSULE | Freq: Four times a day (QID) | ORAL | Status: DC | PRN

## 2016-10-05 MED ORDER — OXYTOCIN 40 UNITS IN LACTATED RINGERS INFUSION - SIMPLE MED
2.5000 [IU]/h | INTRAVENOUS | Status: AC
  Filled 2016-10-05: qty 1000

## 2016-10-05 MED ORDER — FENTANYL CITRATE (PF) 100 MCG/2ML IJ SOLN
25.0000 ug | INTRAMUSCULAR | Status: DC | PRN
  Administered 2016-10-05 (×4): 25 ug via INTRAVENOUS

## 2016-10-05 MED ORDER — IBUPROFEN 800 MG PO TABS
800.0000 mg | ORAL_TABLET | Freq: Four times a day (QID) | ORAL | Status: DC | PRN
  Administered 2016-10-06 – 2016-10-07 (×4): 800 mg via ORAL
  Filled 2016-10-05 (×4): qty 1

## 2016-10-05 MED ORDER — NICOTINE 21 MG/24HR TD PT24
21.0000 mg | MEDICATED_PATCH | Freq: Every day | TRANSDERMAL | Status: DC
  Administered 2016-10-05: 21 mg via TRANSDERMAL
  Filled 2016-10-05 (×4): qty 1

## 2016-10-05 MED ORDER — SIMETHICONE 80 MG PO CHEW: 80.0000 mg | CHEWABLE_TABLET | ORAL | Status: DC

## 2016-10-05 MED ORDER — FENTANYL CITRATE (PF) 100 MCG/2ML IJ SOLN
INTRAMUSCULAR | Status: AC
  Administered 2016-10-05: 25 ug via INTRAVENOUS
  Filled 2016-10-05: qty 2

## 2016-10-05 MED ORDER — DIBUCAINE 1 % RE OINT: 1.0000 "application " | TOPICAL_OINTMENT | RECTAL | Status: DC | PRN

## 2016-10-05 MED ORDER — SENNOSIDES-DOCUSATE SODIUM 8.6-50 MG PO TABS
2.0000 | ORAL_TABLET | ORAL | Status: DC
  Administered 2016-10-05: 2 via ORAL
  Filled 2016-10-05: qty 2

## 2016-10-05 MED ORDER — COCONUT OIL OIL: 1.0000 "application " | TOPICAL_OIL | Status: DC | PRN

## 2016-10-05 MED ORDER — SIMETHICONE 80 MG PO CHEW: 80.0000 mg | CHEWABLE_TABLET | ORAL | Status: DC | PRN

## 2016-10-05 MED ORDER — WITCH HAZEL-GLYCERIN EX PADS: 1.0000 "application " | MEDICATED_PAD | CUTANEOUS | Status: DC | PRN

## 2016-10-05 MED ORDER — LACTATED RINGERS IV SOLN
INTRAVENOUS | Status: DC
  Administered 2016-10-05: 12:00:00 via INTRAVENOUS

## 2016-10-05 MED ORDER — TETANUS-DIPHTH-ACELL PERTUSSIS 5-2.5-18.5 LF-MCG/0.5 IM SUSP: 0.5000 mL | Freq: Once | INTRAMUSCULAR | Status: DC

## 2016-10-05 MED ORDER — SIMETHICONE 80 MG PO CHEW
80.0000 mg | CHEWABLE_TABLET | Freq: Three times a day (TID) | ORAL | Status: DC
  Administered 2016-10-05 – 2016-10-07 (×7): 80 mg via ORAL
  Filled 2016-10-05 (×8): qty 1

## 2016-10-05 MED ORDER — MENTHOL 3 MG MT LOZG
1.0000 | LOZENGE | OROMUCOSAL | Status: DC | PRN
  Filled 2016-10-05: qty 9

## 2016-10-05 MED ORDER — ZOLPIDEM TARTRATE 5 MG PO TABS: 5.0000 mg | ORAL_TABLET | Freq: Every evening | ORAL | Status: DC | PRN

## 2016-10-05 MED ORDER — ONDANSETRON HCL 4 MG/2ML IJ SOLN: 4.0000 mg | Freq: Once | INTRAMUSCULAR | Status: DC | PRN

## 2016-10-05 NOTE — Progress Notes (Signed)
Incentive spirometry teaching done with patient.

## 2016-10-05 NOTE — Progress Notes (Signed)
Subjective:   "pain is finally under control" has not yet voided (foley) or ambulated, tolerating small diet of crackers and sips  Denies CP SOB F/C/N/V/D, leg pain  Objective:  Blood pressure (!) 112/50, pulse 78, temperature 98 F (36.7 C), temperature source Oral, resp. rate 20, height 5\' 7"  (1.702 m), weight 81.6 kg (180 lb), SpO2 98 %, unknown if currently breastfeeding.  General: NAD Pulmonary: no increased work of breathing Abdomen: non-distended, non-tender, fundus firm at level of umbilicus Incision: covered in bandage, c/d/i Extremities: no edema, no erythema, no tenderness  Results for orders placed or performed during the hospital encounter of 10/04/16 (from the past 24 hour(s))  Rapid HIV screen (HIV 1/2 Ab+Ag)     Status: None   Collection Time: 10/04/16  6:56 PM  Result Value Ref Range   HIV-1 P24 Antigen - HIV24 NON REACTIVE NON REACTIVE   HIV 1/2 Antibodies NON REACTIVE NON REACTIVE   Interpretation (HIV Ag Ab)      A non reactive test result means that HIV 1 or HIV 2 antibodies and HIV 1 p24 antigen were not detected in the specimen.  CBC     Status: None   Collection Time: 10/04/16  6:56 PM  Result Value Ref Range   WBC 10.6 3.6 - 11.0 K/uL   RBC 4.01 3.80 - 5.20 MIL/uL   Hemoglobin 12.6 12.0 - 16.0 g/dL   HCT 95.2 84.1 - 32.4 %   MCV 89.9 80.0 - 100.0 fL   MCH 31.5 26.0 - 34.0 pg   MCHC 35.0 32.0 - 36.0 g/dL   RDW 40.1 02.7 - 25.3 %   Platelets 232 150 - 440 K/uL  Comprehensive metabolic panel     Status: Abnormal   Collection Time: 10/04/16  6:56 PM  Result Value Ref Range   Sodium 134 (L) 135 - 145 mmol/L   Potassium 3.7 3.5 - 5.1 mmol/L   Chloride 106 101 - 111 mmol/L   CO2 20 (L) 22 - 32 mmol/L   Glucose, Bld 80 65 - 99 mg/dL   BUN 6 6 - 20 mg/dL   Creatinine, Ser 6.64 0.44 - 1.00 mg/dL   Calcium 8.6 (L) 8.9 - 10.3 mg/dL   Total Protein 6.6 6.5 - 8.1 g/dL   Albumin 3.2 (L) 3.5 - 5.0 g/dL   AST 18 15 - 41 U/L   ALT 10 (L) 14 - 54 U/L   Alkaline Phosphatase 102 38 - 126 U/L   Total Bilirubin 0.3 0.3 - 1.2 mg/dL   GFR calc non Af Amer >60 >60 mL/min   GFR calc Af Amer >60 >60 mL/min   Anion gap 8 5 - 15  Type and screen [facility]     Status: None   Collection Time: 10/04/16  6:56 PM  Result Value Ref Range   ABO/RH(D) A POS    Antibody Screen NEG    Sample Expiration 10/07/2016   Chlamydia/NGC rt PCR ([facility] only)     Status: None   Collection Time: 10/04/16  7:00 PM  Result Value Ref Range   Specimen source GC/Chlam ENDOCERVICAL    Chlamydia Tr NOT DETECTED NOT DETECTED   N gonorrhoeae NOT DETECTED NOT DETECTED  Urine Drug Screen, Qualitative ([facility] only)     Status: None   Collection Time: 10/04/16  7:00 PM  Result Value Ref Range   Tricyclic, Ur Screen NONE DETECTED NONE DETECTED   Amphetamines, Ur Screen NONE DETECTED NONE DETECTED   MDMA (  Ecstasy)Ur Screen NONE DETECTED NONE DETECTED   Cocaine Metabolite,Ur [employer] NONE DETECTED NONE DETECTED   Opiate, Ur Screen NONE DETECTED NONE DETECTED   Phencyclidine (PCP) Ur S NONE DETECTED NONE DETECTED   Cannabinoid 50 Ng, Ur Maria Antonia NONE DETECTED NONE DETECTED   Barbiturates, Ur Screen NONE DETECTED NONE DETECTED   Benzodiazepine, Ur Scrn NONE DETECTED NONE DETECTED   Methadone Scn, Ur NONE DETECTED NONE DETECTED  CBC     Status: Abnormal   Collection Time: 10/05/16  5:02 AM  Result Value Ref Range   WBC 12.0 (H) 3.6 - 11.0 K/uL   RBC 3.72 (L) 3.80 - 5.20 MIL/uL   Hemoglobin 11.6 (L) 12.0 - 16.0 g/dL   HCT 81.133.8 (L) 91.435.0 - 78.247.0 %   MCV 90.9 80.0 - 100.0 fL   MCH 31.3 26.0 - 34.0 pg   MCHC 34.4 32.0 - 36.0 g/dL   RDW 95.613.3 21.311.5 - 08.614.5 %   Platelets 181 150 - 440 K/uL    Intake/Output Summary (Last 24 hours) at 10/05/16 1406 Last data filed at 10/05/16 1404  Gross per 24 hour  Intake          1786.75 ml  Output              800 ml  Net           986.75 ml      Assessment:   26 y.o. G3P2003 postoperativeday # 1 from LTCS    Plan:   1.  Post op care: routine, remove foley and get OOB. 2. Pain control with scheduled tylenol/toradol or ibuprofen.   3. Continue scheduled subtex 4. Continue inpatient admission  ----- Ranae Plumberhelsea Ward, MD Attending Obstetrician and Gynecologist [facility], Department of OB/GYN [facility]

## 2016-10-05 NOTE — Anesthesia Post-op Follow-up Note (Cosign Needed)
  Anesthesia Pain Follow-up Note  Patient: Diana Richard  Day #: 1  Date of Follow-up: 10/05/2016 Time: 7:27 AM  Last Vitals:  Vitals:   10/05/16 0345 10/05/16 0450  BP: (!) 105/57 (!) 112/50  Pulse: 60 78  Resp: 18 20  Temp: 36.7 C 36.7 C    Level of Consciousness: alert  Pain: mild   Side Effects:None  Catheter Site Exam:no drainage     Plan: D/C from anesthesia care at surgeon's request  Rica MastBachich,  Braydn Carneiro M

## 2016-10-05 NOTE — Anesthesia Postprocedure Evaluation (Signed)
Anesthesia Post Note  Patient: Revonda HumphreyRachel E Bredeson  Procedure(s) Performed: Procedure(s) (LRB): CESAREAN SECTION (N/A)  Patient location during evaluation: Mother Baby Anesthesia Type: Spinal Level of consciousness: awake and alert Pain management: pain level controlled Vital Signs Assessment: post-procedure vital signs reviewed and stable Respiratory status: spontaneous breathing Cardiovascular status: stable Postop Assessment: no signs of nausea or vomiting Anesthetic complications: no     Last Vitals:  Vitals:   10/05/16 0345 10/05/16 0450  BP: (!) 105/57 (!) 112/50  Pulse: 60 78  Resp: 18 20  Temp: 36.7 C 36.7 C    Last Pain:  Vitals:   10/05/16 0450  TempSrc: Oral  PainSc: 6                  Rica MastBachich,  Nyeemah Jennette M

## 2016-10-05 NOTE — Clinical Social Work Maternal (Signed)
  CLINICAL SOCIAL WORK MATERNAL/CHILD NOTE  Patient Details  Name: Diana Richard MRN: [id number] Date of Birth: 09/05/1990  Date:  10/05/2016  Clinical Social Worker Initiating Note:  York SpanielMonica Deneshia Zucker MSW,LCSW  Date/ Time Initiated:  10/05/16/      Child's Name:      Legal Guardian:  Mother   Need for Interpreter:  None   Date of Referral:        Reason for Referral:   (Prescription Subutex Use)   Referral Source:  RN   Address:     Phone number:      Household Members:  Self, Minor Children, Spouse, Relatives, Parents   Natural Supports (not living in the home):      Professional Supports: None   Employment: Unemployed   Type of Work:     Education:      Architectinancial Resources:  [employer]   Other Resources:  English as a second language teacherood Stamps    Cultural/Religious Considerations Which May Impact Care:  none  Strengths:  Ability to meet basic needs , Compliance with medical plan , Home prepared for child    Risk Factors/Current Problems:  Substance Use    Cognitive State:  Alert    Mood/Affect:  Calm , Bright , Relaxed    CSW Assessment: CSW consulted due to patient being on subutex in order to get off of an opiod addiction. CSW spoke with patient this afternoon and she informed CSW that she tried to wean herself off the subutex in the beginning of her pregnancy but due to the concerns she had for the stress it was putting on her body and her unborn child, she [facility] and begin getting the subutex again. Patient reports that she has not relapsed. Patient reports that she has all necessities for her newborn. She states that she has a 593 and a 24 year old in the home and that her husband, her mother, and her grandmother all live in the home with her. Patient reports no concerns regarding transportation. Patient has been educated in regards to postpartum depression. No additional needs at this time.   CSW Plan/Description:  Psychosocial Support and  Ongoing Assessment of Needs    York SpanielMonica Temple Ewart, LCSW 10/05/2016, 4:16 PM

## 2016-10-06 LAB — SURGICAL PATHOLOGY

## 2016-10-06 MED ORDER — ACETAMINOPHEN-CODEINE #3 300-30 MG PO TABS
1.0000 | ORAL_TABLET | Freq: Four times a day (QID) | ORAL | Status: DC | PRN
  Administered 2016-10-07: 2 via ORAL
  Administered 2016-10-07 (×2): 1 via ORAL
  Filled 2016-10-06: qty 2
  Filled 2016-10-06 (×2): qty 1

## 2016-10-06 NOTE — Progress Notes (Signed)
Subjective: Postpartum Day 2: Cesarean Delivery Patient is doing well. Toelrating regular po diet. Pain controlled with scheduled tylenol and ibuprofen.   Objective: Vital signs in last 24 hours: Temp:  [97.7 F (36.5 C)-98.2 F (36.8 C)] 98.2 F (36.8 C) (03/22 0813) Pulse Rate:  [54-68] 68 (03/22 0813) Resp:  [18-20] 18 (03/22 0813) BP: (95-116)/(40-65) 95/56 (03/22 0813) SpO2:  [99 %-100 %] 99 % (03/21 1906)  Physical Exam:  General: alert, cooperative and appears stated age Lochia: appropriate Uterine Fundus: firm Incision: no significant drainage, no significant erythema DVT Evaluation: No evidence of DVT seen on physical exam. Negative Homan's sign. No cords or calf tenderness.   Recent Labs  10/04/16 1856 10/05/16 0502  HGB 12.6 11.6*  HCT 36.0 33.8*    Assessment/Plan: Status post Cesarean section. Doing well postoperatively.  Continue current care. Plan for d/c with tylenol with codeine as not breastfeeding.  [name], Redell Nazir 10/06/2016, 9:08 AM

## 2016-10-07 MED ORDER — ACETAMINOPHEN 500 MG PO TABS: 1000.0000 mg | ORAL_TABLET | Freq: Four times a day (QID) | ORAL | 0 refills | Status: AC

## 2016-10-07 MED ORDER — GABAPENTIN 800 MG PO TABS: 800.0000 mg | ORAL_TABLET | Freq: Every day | ORAL | 0 refills | Status: DC

## 2016-10-07 MED ORDER — ACETAMINOPHEN-CODEINE #3 300-30 MG PO TABS: 1.0000 | ORAL_TABLET | Freq: Four times a day (QID) | ORAL | 0 refills | Status: DC | PRN

## 2016-10-07 MED ORDER — IBUPROFEN 800 MG PO TABS: 800.0000 mg | ORAL_TABLET | Freq: Three times a day (TID) | ORAL | 0 refills | Status: AC

## 2016-10-07 NOTE — Discharge Instructions (Signed)
Please call your doctor or return to the ER if you experience any chest pains, shortness of breath, fever greater than 101, any heavy bleeding or large clots, and foul smelling vaginal discharge, any worsening abdominal pain & cramping that is not controlled by pain medication, or any signs of post partum depression.  Please look at your incision daily and check for redness, warmth, or drainage. No tampons, enemas, douches, or sexual intercourse for 6 weeks.  Also avoid tub baths, hot tubs, or swimming for 6 weeks.  Cesarean Delivery, Care After Refer to this sheet in the next few weeks. These instructions provide you with information about caring for yourself after your procedure. Your health care provider may also give you more specific instructions. Your treatment has been planned according to current medical practices, but problems sometimes occur. Call your health care provider if you have any problems or questions after your procedure. What can I expect after the procedure? After the procedure, it is common to have:  A small amount of blood or clear fluid coming from the incision.  Some redness, swelling, and pain in your incision area.  Some abdominal pain and soreness.  Vaginal bleeding (lochia).  Pelvic cramps.  Fatigue. Follow these instructions at home: Incision care    Follow instructions from your health care provider about how to take care of your incision. Make sure you:  Wash your hands with soap and water before you change your bandage (dressing). If soap and water are not available, use hand sanitizer.  Change your dressing as told by your health care provider.  Leave stitches (sutures), skin staples, skin glue, or adhesive strips in place. These skin closures may need to stay in place for 2 weeks or longer. If adhesive strip edges start to loosen and curl up, you may trim the loose edges. Do not remove adhesive strips completely unless your health care provider tells  you to do that.  Check your incision area every day for signs of infection. Check for:  More redness, swelling, or pain.  More fluid or blood.  Warmth.  Pus or a bad smell.  When you cough or sneeze, hug a pillow. This helps with pain and decreases the chance of your incision opening up (dehiscing). Do this until your incision heals. Medicines   Take over-the-counter and prescription medicines only as told by your health care provider.  If you were prescribed an antibiotic medicine, take it as told by your health care provider. Do not stop taking the antibiotic until it is finished. Driving   Do not drive or operate heavy machinery while taking prescription pain medicine.  Do not drive for 24 hours if you received a sedative. Lifestyle   Do not drink alcohol. This is especially important if you are breastfeeding or taking pain medicine.  Do not use tobacco products, including cigarettes, chewing tobacco, or e-cigarettes. If you need help quitting, ask your health care provider. Tobacco can delay wound healing. Eating and drinking   Drink at least 8 eight-ounce glasses of water every day unless told not to by your health care provider. If you breastfeed, you may need to drink more water than this.  Eat high-fiber foods every day. These foods may help prevent or relieve constipation. High-fiber foods include:  Whole grain cereals and breads.  Brown rice.  Beans.  Fresh fruits and vegetables. Activity   Return to your normal activities as told by your health care provider. Ask your health care provider what activities  are safe for you.  Rest as much as possible. Try to rest or take a nap while your baby is sleeping.  Do not lift anything that is heavier than your baby or 10 lb (4.5 kg) as told by your health care provider.  Ask your health care provider when you can engage in sexual activity. This may depend on your:  Risk of infection.  Healing rate.  Comfort and  desire to engage in sexual activity. Bathing   Do not take baths, swim, or use a hot tub until your health care provider approves. Ask your health care provider if you can take showers. You may only be allowed to take sponge baths until your incision heals.  Keep your dressing dry as told by your health care provider. General instructions   Do not use tampons or douches until your health care provider approves.  Wear:  Loose, comfortable clothing.  A supportive and well-fitting bra.  Watch for any blood clots that may pass from your vagina. These may look like clumps of dark red, brown, or black discharge.  Keep your perineum clean and dry as told by your health care provider.  Wipe from front to back when you use the toilet.  If possible, have someone help you care for your baby and help with household activities for a few days after you leave the hospital.  Keep all follow-up visits for you and your baby as told by your health care provider. This is important. Contact a health care provider if:  You have:  Bad-smelling vaginal discharge.  Difficulty urinating.  Pain when urinating.  A sudden increase or decrease in the frequency of your bowel movements.  More redness, swelling, or pain around your incision.  More fluid or blood coming from your incision.  Pus or a bad smell coming from your incision.  A fever.  A rash.  Little or no interest in activities you used to enjoy.  Questions about caring for yourself or your baby.  Nausea.  Your incision feels warm to the touch.  Your breasts turn red or become painful or hard.  You feel unusually sad or worried.  You vomit.  You pass large blood clots from your vagina. If you pass a blood clot, save it to show to your health care provider. Do not flush blood clots down the toilet without showing your health care provider.  You urinate more than usual.  You are dizzy or light-headed.  You have not  breastfed and have not had a menstrual period for 12 weeks after delivery.  You stopped breastfeeding and have not had a menstrual period for 12 weeks after stopping breastfeeding. Get help right away if:  You have:  Pain that does not go away or get better with medicine.  Chest pain.  Difficulty breathing.  Blurred vision or spots in your vision.  Thoughts about hurting yourself or your baby.  New pain in your abdomen or in one of your legs.  A severe headache.  You faint.  You bleed from your vagina so much that you fill two sanitary pads in one hour.  Home Care Instructions for Mom  ACTIVITY  Gradually return to your regular activities.  Let yourself rest. Nap while your baby sleeps.  Avoid lifting anything that is heavier than 10 lb (4.5 kg) until your health care provider says it is okay.  Avoid activities that take a lot of effort and energy (are strenuous) until approved by your health care  provider. Walking at a slow-to-moderate pace is usually safe.  If you had a cesarean delivery:  Do not vacuum, climb stairs, or drive a car for 4-6 weeks.  Have someone help you at home until you feel like you can do your usual activities yourself.  Do exercises as told by your health care provider, if this applies. VAGINAL BLEEDING You may continue to bleed for 4-6 weeks after delivery. Over time, the amount of blood usually decreases and the color of the blood usually gets lighter. However, the flow of bright red blood may increase if you have been too active. If you need to use more than one pad in an hour because your pad gets soaked, or if you pass a large clot:  Lie down.  Raise your feet.  Place a cold compress on your lower abdomen.  Rest.  Call your health care provider. If you are breastfeeding, your period should return anytime between 8 weeks after delivery and the time that you stop breastfeeding. If you are not breastfeeding, your period should return 6-8  weeks after delivery. PERINEAL CARE The perineal area, or perineum, is the part of your body between your thighs. After delivery, this area needs special care. Follow these instructions as told by your health care provider.  Take warm tub baths for 15-20 minutes.  Use medicated pads and pain-relieving sprays and creams as told.  Do not use tampons or douches until vaginal bleeding has stopped.  Each time you go to the bathroom:  Use a peri bottle.  Change your pad.  Use towelettes in place of toilet paper until your stitches have healed.  Do Kegel exercises every day. Kegel exercises help to maintain the muscles that support the vagina, bladder, and bowels. You can do these exercises while you are standing, sitting, or lying down. To do Kegel exercises:  Tighten the muscles of your abdomen and the muscles that surround your birth canal.  Hold for a few seconds.  Relax.  Repeat until you have done this 5 times in a row.  To prevent hemorrhoids from developing or getting worse:  Drink enough fluid to keep your urine clear or pale yellow.  Avoid straining when having a bowel movement.  Take over-the-counter medicines and stool softeners as told by your health care provider. BREAST CARE  Wear a tight-fitting bra.  Avoid taking over-the-counter pain medicine for breast discomfort.  Apply ice to the breasts to help with discomfort as needed:  Put ice in a plastic bag.  Place a towel between your skin and the bag.  Leave the ice on for 20 minutes or as told by your health care provider. NUTRITION  Eat a well-balanced diet.  Do not try to lose weight quickly by cutting back on calories.  Take your prenatal vitamins until your postpartum checkup or until your health care provider tells you to stop. POSTPARTUM DEPRESSION You may find yourself crying for no apparent reason and unable to cope with all of the changes that come with having a newborn. This mood is called  postpartum depression. Postpartum depression happens because your hormone levels change after delivery. If you have postpartum depression, get support from your partner, friends, and family. If the depression does not go away on its own after several weeks, contact your health care provider. BREAST SELF-EXAM Do a breast self-exam each month, at the same time of the month. If you are breastfeeding, check your breasts just after a feeding, when your breasts are less  full. If you are breastfeeding and your period has started, check your breasts on day 5, 6, or 7 of your period. Report any lumps, bumps, or discharge to your health care provider. Know that breasts are normally lumpy if you are breastfeeding. This is temporary, and it is not a health risk. INTIMACY AND SEXUALITY Avoid sexual activity for at least 3-4 weeks after delivery or until the brownish-red vaginal flow is completely gone. If you want to avoid pregnancy, use some form of birth control. You can get pregnant after delivery, even if you have not had your period. SEEK MEDICAL CARE IF:  You feel unable to cope with the changes that a child brings to your life, and these feelings do not go away after several weeks.  You notice a lump, a bump, or discharge on your breast. SEEK IMMEDIATE MEDICAL CARE IF:  Blood soaks your pad in 1 hour or less.  You have:  Severe pain or cramping in your lower abdomen.  A bad-smelling vaginal discharge.  A fever that is not controlled by medicine.  A fever, and an area of your breast is red and sore.  Pain or redness in your calf.  Sudden, severe chest pain.  Shortness of breath.  Painful or bloody urination.  Problems with your vision.  You vomit for 12 hours or longer.  You develop a severe headache.  You have serious thoughts about hurting yourself, your child, or anyone else. This information is not intended to replace advice given to you by your health care provider. Make sure  you discuss any questions you have with your health care provider. Document Released: 07/01/2000 Document Revised: 12/10/2015 Document Reviewed: 01/05/2015 Elsevier Interactive Patient Education  2017 [employer].

## 2016-10-07 NOTE — Plan of Care (Signed)
Reminded patient of the plan of care for the infant in the event his NAS scores were consistently high.  Gave her the phone number for the SCN and the times when she could visit the infant.  Advised patient to pump every 4 hours if she intends to continue offering breast milk to the baby while he's in the SCN.  Patient had no questions.

## 2016-10-07 NOTE — Lactation Note (Signed)
Lactation Consultation Note  Patient Name: Diana HumphreyRachel E Beightol ZOXWR'UToday's Date: 10/07/2016     Maternal Data  Pt had been pumping breasts yesterday, had been planning to feed formula, has decided not to pump as baby's NAS have increased and baby getting morphine, reviewed ways to prevent engorgement and treatments to relieve engorgement ie, cabbage leaves, tight bra, no stimulation, etc.   Feeding    LATCH Score/Interventions                      Lactation Tools Discussed/Used     Consult Status      Dyann KiefMarsha D Jetty Berland 10/07/2016, 3:43 PM

## 2016-10-07 NOTE — Progress Notes (Signed)
D/C order from MD.  Reviewed d/c instructions and prescriptions with patient and answered any questions.  Patient d/c home  via wheelchair by nursing/auxillary. Infant remains in SCN.

## 2016-10-10 NOTE — Anesthesia Postprocedure Evaluation (Signed)
Anesthesia Post Note  Patient: Diana Richard  Procedure(s) Performed: Procedure(s) (LRB): CESAREAN SECTION (N/A)  Patient location during evaluation: PACU Anesthesia Type: Spinal Level of consciousness: oriented and awake and alert Pain management: pain level controlled Vital Signs Assessment: post-procedure vital signs reviewed and stable Respiratory status: spontaneous breathing, respiratory function stable and patient connected to nasal cannula oxygen Cardiovascular status: blood pressure returned to baseline and stable Postop Assessment: no headache and no backache Anesthetic complications: no     Last Vitals:  Vitals:   10/07/16 0739 10/07/16 1117  BP: 118/67   Pulse: 63   Resp: 18   Temp: 36.6 C 37.1 C    Last Pain:  Vitals:   10/07/16 1303  TempSrc:   PainSc: 3                  Yevette EdwardsJames G Doyle Kunath

## 2016-10-27 LAB — HEPATITIS B SURFACE ANTIGEN: Hepatitis B Surface Ag: NEGATIVE

## 2016-10-27 LAB — RUBELLA SCREEN: Rubella: 2.72 index (ref >0.99)

## 2016-10-27 LAB — HEPATITIS C VRS RNA DETECT BY PCR-QUAL: Hepatitis C Vrs RNA by PCR-Qual: NEGATIVE

## 2016-10-27 LAB — RPR: RPR: NONREACTIVE

## 2016-10-27 NOTE — Discharge Summary (Signed)
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 Postoperative state 10/05/2016  . Pregnancy affected by fetal growth restriction 10/04/2016  . Labor and delivery, indication for care 06/30/2015  . Indication for care or intervention related to labor and delivery 06/30/2015  . Variable fetal heart rate decelerations, antepartum 06/03/2015  . First trimester screening 12/22/2014    Intrapartum complications/course: pLTCS for transverse lie with IUGR Date of Delivery: 10/04/16 Delivered By: Schermerhorn Delivery Type: primary cesarean section, low transverse incision Anesthesia: spinal Placenta: Spontaneous Laceration:  Episiotomy: none Newborn Data: Live born female  Birth Weight: 5 lb 7.1 oz (2470 g) APGAR: 9, 9    Discharge Physical Exam:  BP 118/67 (BP Location: Right Arm)   Pulse 63   Temp 98.8 F (37.1 C) (Oral)   Resp 18   Ht  (1.702 m)   Wt 180 lb (81.6 kg)   SpO2 99%   Breastfeeding? Unknown   BMI 28.19 kg/m   General: NAD CV: RRR Pulm: CTABL, nl effort ABD: s/nd/nt, fundus firm and below the umbilicus Lochia: moderate Incision: c/d/i  DVT Evaluation: LE non-ttp, no evidence of DVT on exam.  Hemoglobin  Date Value Ref Range Status  10/05/2016 11.6 (L) 12.0 - 16.0 g/dL Final   HGB  Date Value Ref Range Status  03/09/2014 14.1 12.0 - 16.0 g/dL Final   HCT  Date Value Ref Range Status  10/05/2016 33.8 (L) 35.0 - 47.0 % Final  03/11/2014 33.1 (L) 35.0 - 47.0 % Final    Post partum course: difficulty with pain control Postpartum Procedures: none Disposition: stable, discharge to home. Baby  Feeding: formula Baby Disposition: in SCN   Plan:  Diana Richard was discharged to home in good condition. [facility]-up appointment at [facility] 2 weeks   Discharge Medications: Allergies as of 10/07/2016   No Known Allergies     Medication List    TAKE these medications   acetaminophen-codeine 300-30 MG tablet Commonly known as:  TYLENOL #3 Take 1-2 tablets by mouth every 6 (six) hours as needed for moderate pain.   buprenorphine 8 MG Subl SL tablet Commonly known as:  SUBUTEX Place 8 mg under the tongue daily.   gabapentin 800 MG tablet Commonly known as:  NEURONTIN Take 1 tablet (800 mg total) by mouth at bedtime.   ibuprofen 600 MG tablet Commonly known as:  ADVIL,MOTRIN Take 1 tablet (600 mg total) by mouth every 6 (six) hours as needed.   norethindrone 0.35 MG tablet Commonly known as:  ORTHO MICRONOR Take 1 tablet (0.35 mg total) by mouth daily.   prenatal multivitamin Tabs tablet Take 1 tablet by mouth daily at 12 noon.     ASK your doctor about these medications   acetaminophen 500 MG tablet Commonly known as:  TYLENOL Take 2 tablets (1,000 mg total) by mouth every 6 (six) hours. Ask about: Should I take this medication?   ibuprofen 800 MG tablet Commonly known as:  ADVIL,MOTRIN Take 1 tablet (800 mg total) by mouth every 8 (eight) hours. Take 1 tab ( ) every 8 hours for the first 3 days after surgery. You can use it as needed after these three days. Ask about: Should I take  this medication?       Follow-up Information    SCHERMERHORN,THOMAS, MD. Schedule an appointment as soon as possible for a visit in 2 week(s).   Specialty:  Obstetrics and Gynecology Why:  postop Contact information: [address] [place] [address] [phone]           Signed: Christeen Douglas

## 2017-01-12 ENCOUNTER — Emergency Department
Admission: EM | Admit: 2017-01-12 | Discharge: 2017-01-12 | Disposition: A | Discharge status: Home / Self Care | Payer: Medicaid Other | Attending: Emergency Medicine | Admitting: Emergency Medicine

## 2017-01-12 ENCOUNTER — Encounter: Payer: Self-pay | Admitting: Emergency Medicine

## 2017-01-12 DIAGNOSIS — Z79899 Other long term (current) drug therapy: Secondary | ICD-10-CM | POA: Insufficient documentation

## 2017-01-12 DIAGNOSIS — F17210 Nicotine dependence, cigarettes, uncomplicated: Secondary | ICD-10-CM | POA: Insufficient documentation

## 2017-01-12 DIAGNOSIS — K047 Periapical abscess without sinus: Secondary | ICD-10-CM | POA: Diagnosis not present

## 2017-01-12 DIAGNOSIS — K0889 Other specified disorders of teeth and supporting structures: Secondary | ICD-10-CM | POA: Diagnosis present

## 2017-01-12 DIAGNOSIS — R600 Localized edema: Secondary | ICD-10-CM | POA: Diagnosis not present

## 2017-01-12 MED ORDER — CLINDAMYCIN PHOSPHATE 600 MG/4ML IJ SOLN
600.0000 mg | Freq: Once | INTRAMUSCULAR | Status: AC
  Administered 2017-01-12: 600 mg via INTRAMUSCULAR
  Filled 2017-01-12: qty 4

## 2017-01-12 MED ORDER — CLINDAMYCIN HCL 300 MG PO CAPS: 300.0000 mg | ORAL_CAPSULE | Freq: Three times a day (TID) | ORAL | 0 refills | Status: AC

## 2017-01-12 NOTE — ED Triage Notes (Signed)
Pt ambulatory to triage in NAD, report tooth broken left lower mouth last month, last night with heating pad to left lower jaw, woke up with redness/swelling to face outside mouth.  Pt took ibuprofen at home w/o relief.

## 2017-01-12 NOTE — Discharge Instructions (Signed)
OPTIONS FOR DENTAL FOLLOW UP CARE ° °[place] [facility] and [facility] - Local Safety Net Dental Clinics °http://www.ncdhhs.gov/dph/oralhealth/services/safetynetclinics.htm °  °[facility] (336-562-3123) ° °[facility] [place] (919-933-9087) ° °[facility] ([phone] ext [phone]) ° °[facility] ([phone]) ° °[facility] ([phone]) °This clinic caters to the indigent population and is on a lottery system. °Location: °[facility], [address], [place] °Clinic Hours: °Wednesdays from 6pm - 9pm, patients seen by a lottery system. °For dates, call or go to www.med.[facility].edu/[facility]/patients/Dental-[facility] °Services: °Cleanings, fillings and simple extractions. °Payment Options: °DENTAL WORK IS FREE OF CHARGE. Bring proof of income or support. °Best way to get seen: °Arrive at 5:15 pm - this is a lottery, NOT first come/first serve, so arriving earlier will not increase your chances of being seen. °  °  °[facility] °[phone] °Select option 1 for emergencies °  °Location: °[facility], [address], [place] °Clinic Hours: °No walk-ins accepted - call the day before to schedule an appointment. °Check in times are 9:30 am and 1:30 pm. °Services: °Simple extractions, temporary fillings, pulpectomy/pulp debridement, uncomplicated abscess drainage. °Payment Options: °PAYMENT IS DUE AT THE TIME OF SERVICE.  Fee is usually $100-200, additional surgical procedures (e.g. abscess drainage) may be extra. °Cash, checks, Visa/MasterCard accepted.  Can file Medicaid if patient is covered for dental - patient should call case worker to check. °No discount for [facility] patients. °Best way to get seen: °MUST call the day before and get onto the schedule. Can usually be seen the next 1-2 days. No walk-ins accepted. °  °  °[facility] °[phone] °   °Location: °[facility], [address], [place] °Clinic Hours: °M, W, Th, F 8am or 1:30pm, Tues 9a or 1:30 - first come/first served. °Services: °Simple extractions, temporary fillings, uncomplicated abscess drainage.  You do not need to be an [place] resident. °Payment Options: °PAYMENT IS DUE AT THE TIME OF SERVICE. °Dental insurance, otherwise sliding scale - bring proof of income or support. °Depending on income and treatment needed, cost is usually $50-200. °Best way to get seen: °Arrive early as it is first come/first served. °  °  °[facility] °[phone] °  °Location: °[address] °Clinic Hours: °Mon-Thu 8a-5p °Services: °Most basic dental services including extractions and fillings. °Payment Options: °PAYMENT IS DUE AT THE TIME OF SERVICE. °Sliding scale, up to 50% off - bring proof if income or support. °Medicaid with dental option accepted. °Best way to get seen: °Call to schedule an appointment, can usually be seen within 2 weeks OR they will try to see walk-ins - show up at 8a or 2p (you may have to wait). °  °  °[facility] °919-245-2435 °[place] RESIDENTS ONLY °  °Location: °[facility], [address], [place], [place] [address] °Clinic Hours: By appointment only. °Monday - Thursday 8am-5pm, Friday 8am-12pm °Services: Cleanings, fillings, extractions. °Payment Options: °PAYMENT IS DUE AT THE TIME OF SERVICE. °Cash, Visa or MasterCard. Sliding scale - $30 minimum per service. °Best way to get seen: °Come in to office, complete packet and make an appointment - need proof of income °or support monies for each household member and proof of [place] residence. °Usually takes about a month to get in. °  °  °[facility] °[phone] °  °Location: °[address]   [place] °Clinic Hours: Walk-in Urgent Care Dental Services are offered Monday-Friday  mornings only. °The numbers of emergencies accepted daily is limited to the number of °providers available. °Maximum 15 - Mondays, Wednesdays & Thursdays °Maximum 10 - Tuesdays & Fridays °Services: °You do not need to be a [place] resident to be seen for a dental emergency. °Emergencies are defined as pain, swelling, abnormal bleeding, or dental trauma. Walkins will receive x-rays if needed. °NOTE: Dental cleaning is not an emergency. °Payment Options: °PAYMENT IS DUE AT THE TIME OF SERVICE. °Minimum co-pay is $40.00 for uninsured patients. °Minimum co-pay is $3.00 for Medicaid with dental coverage. °Dental Insurance is accepted and must be presented at time of visit. °Medicare does not cover dental. °Forms of payment: Cash, credit card, checks. °Best way to get seen: °If not previously registered with the clinic, walk-in dental registration begins at 7:15 am and is on a first come/first serve basis. °If previously registered with the clinic, call to make an appointment. °  °  °[facility] °[phone] °[place] RESIDENTS ONLY °  °Location: °[address], [place], [place] °Clinic Hours: °Mon-Thu 10a-2p °Services: Extractions only! °Payment Options: °FREE (donations accepted) - bring proof of income or support °Best way to get seen: °Call and schedule an appointment OR come at 8am on the 1st Monday of every month (except for holidays) when it is first come/first served. °  °  °[facility] °[phone] °  °Location: °[address], [place] °Clinic Hours: °Friday mornings °Services, Payment Options, Best way to get seen: °Call for info °

## 2017-01-12 NOTE — ED Notes (Signed)

## 2017-01-13 NOTE — ED Provider Notes (Signed)
[facility] Emergency Department Provider Note  ____________________________________________  Time seen: Approximately 4:49 PM  I have reviewed the triage vital signs and the nursing notes.   HISTORY  Chief Complaint Dental Pain and Facial Swelling    HPI Diana Richard is a 26 y.o. female presents to the emergency department with a left lower jaw dental abscess and 9/10 dental pain from a broken Inferior 18. She states that she broke Inferior 18 approximately one month ago. Patient states that she is in the process of seeking care with a local dentist. She denies fever or chills. She denies dysphagia. She is speaking in complete sentences and managing her own secretions. No alleviating measures have been attempted.    Past Medical History:  Diagnosis Date  . Anxiety   . Depression     Patient Active Problem List   Diagnosis Date Noted  . Postoperative state 10/05/2016  . Pregnancy affected by fetal growth restriction 10/04/2016  . Labor and delivery, indication for care 06/30/2015  . Indication for care or intervention related to labor and delivery 06/30/2015  . Variable fetal heart rate decelerations, antepartum 06/03/2015  . First trimester screening 12/22/2014    Past Surgical History:  Procedure Laterality Date  . CESAREAN SECTION N/A 10/04/2016   Procedure: CESAREAN SECTION;  Surgeon: Suzy Bouchard, MD;  Location: [facility] ORS;  Service: Obstetrics;  Laterality: N/A;  . LUMBAR LAMINECTOMY/DECOMPRESSION MICRODISCECTOMY Left 10/11/2012   Procedure: LUMBAR LAMINECTOMY/DECOMPRESSION MICRODISCECTOMY 1 LEVEL;  Surgeon: Tia Alert, MD;  Location: [facility] ORS;  Service: Neurosurgery;  Laterality: Left;  left lumbar four-five  . LUMBAR LAMINECTOMY/DECOMPRESSION MICRODISCECTOMY Right 06/07/2013   Procedure: Right Lumbar five-sacral one microdiskectomy ;  Surgeon: Tia Alert, MD;  Location: [facility] OR[facility];  Service: Neurosurgery;  Laterality:  Right;  Right Lumbar five-sacral one microdiskectomy   . TONSILLECTOMY      Prior to Admission medications   Medication Sig Start Date End Date Taking? Authorizing Provider  acetaminophen-codeine (TYLENOL #3) 300-30 MG tablet Take 1-2 tablets by mouth every 6 (six) hours as needed for moderate pain. 10/07/16   Christeen Douglas, MD  buprenorphine (SUBUTEX) 8 MG SUBL SL tablet Place 8 mg under the tongue daily.    [provider]  clindamycin (CLEOCIN) 300 MG capsule Take 1 capsule (300 mg total) by mouth 3 (three) times daily. 01/12/17 01/22/17  Orvil Feil, PA-C  gabapentin (NEURONTIN) 800 MG tablet Take 1 tablet (800 mg total) by mouth at bedtime. 10/07/16 10/10/16  Christeen Douglas, MD  ibuprofen (ADVIL,MOTRIN) 600 MG tablet Take 1 tablet (600 mg total) by mouth every 6 (six) hours as needed. 07/02/15   Schermerhorn, Ihor Austin, MD  norethindrone (ORTHO MICRONOR) 0.35 MG tablet Take 1 tablet (0.35 mg total) by mouth daily. Patient not taking: Reported on 10/04/2016 07/02/15   Schermerhorn, Ihor Austin, MD  Prenatal Vit-Fe Fumarate-FA (PRENATAL MULTIVITAMIN) TABS tablet Take 1 tablet by mouth daily at 12 noon.    [provider]    Allergies Patient has no known allergies.  History reviewed. No pertinent family history.  Social History Social History  Substance Use Topics  . Smoking status: Current Every Day Smoker    Packs/day: 0.25    Years: 6.00    Types: Cigarettes  . Smokeless tobacco: Never Used  . Alcohol use No     Review of Systems  Constitutional: No fever/chills Eyes: No visual changes. No discharge ENT: Patient has left lower jaw dental abscess.  Cardiovascular:  no chest pain. Respiratory: no cough. No SOB. Gastrointestinal: No abdominal pain.  No nausea, no vomiting.  No diarrhea.  No constipation. Musculoskeletal: Negative for musculoskeletal pain. Skin: Negative for rash, abrasions, lacerations, ecchymosis. Neurological: Negative for headaches,  focal weakness or numbness.   ____________________________________________   PHYSICAL EXAM:  VITAL SIGNS: ED Triage Vitals  Enc Vitals Group     BP 01/12/17 1915 (!) 150/92     Pulse Rate 01/12/17 1915 98     Resp 01/12/17 1915 18     Temp 01/12/17 1915 98.9 F (37.2 C)     Temp Source 01/12/17 1915 Oral     SpO2 01/12/17 1915 100 %     Weight 01/12/17 1915 179 lb (81.2 kg)     Height 01/12/17 1915 5\' 7"  (1.702 m)     Head Circumference --      Peak Flow --      Pain Score 01/12/17 1914 7     Pain Loc --      Pain Edu? --      Excl. in GC? --      Constitutional: Alert and oriented. Well appearing and in no acute distress. Eyes: Conjunctivae are normal. PERRL. EOMI. Head: Atraumatic. ENT:      Mouth/Throat: Patient has broken inferior 18. She has edema of the left lower jaw. Cardiovascular: Normal rate, regular rhythm. Normal S1 and S2.  Good peripheral circulation. Respiratory: Normal respiratory effort without tachypnea or retractions. Lungs CTAB. Good air entry to the bases with no decreased or absent breath sounds. Musculoskeletal: Full range of motion to all extremities. No gross deformities appreciated. Neurologic:  Normal speech and language. No gross focal neurologic deficits are appreciated.  Skin:  Skin is warm, dry and intact. No rash noted. Psychiatric: Mood and affect are normal. Speech and behavior are normal. Patient exhibits appropriate insight and judgement.   ____________________________________________   LABS (all labs ordered are listed, but only abnormal results are displayed)  Labs Reviewed - No data to display ____________________________________________  EKG   ____________________________________________  RADIOLOGY  No results found.  ____________________________________________    PROCEDURES  Procedure(s) performed:    Procedures    Medications  clindamycin (CLEOCIN) injection 600 mg (600 mg Intramuscular Given 01/12/17  2143)     ____________________________________________   INITIAL IMPRESSION / ASSESSMENT AND PLAN / ED COURSE  Pertinent labs & imaging results that were available during my care of the patient were reviewed by me and considered in my medical decision making (see chart for details).  Review of the [place] CSRS was performed in accordance of the [facility] prior to dispensing any controlled drugs.     Assessment and plan: Dental abscess: Patient presents to the emergency department with a dental abscess of the left lower jaw. Patient was given an injection of clindamycin in the emergency department. She was discharged with clindamycin. Patient was advised to seek care with a local dentist as soon as possible. Vital signs are reassuring prior to discharge. All patient questions were answered.  ____________________________________________  FINAL CLINICAL IMPRESSION(S) / ED DIAGNOSES  Final diagnoses:  Dental abscess      NEW MEDICATIONS STARTED DURING THIS VISIT:  Discharge Medication List as of 01/12/2017  9:09 PM    START taking these medications   Details  clindamycin (CLEOCIN) 300 MG capsule Take 1 capsule (300 mg total) by mouth 3 (three) times daily., Starting Thu 01/12/2017, Until Sun 01/22/2017, Print  This chart was dictated using voice recognition software/Dragon. Despite best efforts to proofread, errors can occur which can change the meaning. Any change was purely unintentional.    Orvil Feil, PA-C 01/13/17 1726    Jeanmarie Plant, MD 01/16/17 [phone]

## 2017-06-03 IMAGING — US US MFM OB DETAIL+14 WK
1 series · 13 of 28 positions shown · non-contrast
Comparison: none

PATIENT INFO:

PERFORMED BY:
SERVICE(S) PROVIDED:
INDICATIONS:
17 weeks gestation of pregnancy
FETAL EVALUATION:
Num Of Fetuses:     1
Fetal Heart         143
Rate(bpm):
Presentation:       Breech
Placenta:           Anterior
BIOMETRY:
BPD:      38.5  mm     G. Age:  17w 5d         64  %    CI:        69.27   %    70 - 86
FL/HC:       16.2  %    14.6 -
HC:      147.7  mm     G. Age:  17w 6d         65  %
FL/BPD:      62.1  %
FL:       23.9  mm     G. Age:  17w 1d         37  %
HUM:        25  mm     G. Age:  17w 6d         67  %
CER:      15.4  mm     G. Age:  15w 3d        < 5  %
CM:        3.6  mm
GESTATIONAL AGE:
Clinical EDD:  17w 3d                                        EDD:   10/10/16
U/S Today:     17w 4d                                        EDD:   10/09/16
Best:          17w 3d     Det. By:  Early Exam  (03/14/16)   EDD:   10/10/16
ANATOMY:
Cavum:                 CSP visualized         Ductal Arch:            Normal appearance
Ventricles:            Normal appearance      Diaphragm:              Within Normal Limits
Cerebellum:            Within Normal Limits   Stomach:                Seen
Posterior Fossa:       Within Normal Limits   Abdomen:                Within Normal
Limits
Nuchal Fold:           Within Normal Limits   Abdominal Wall:         Normal appearance
Face:                  Orbits visualized      Cord Vessels:           3 vessels
Lips:                  Normal appearance      Kidneys:                Normal appearance
Thoracic:              Within Normal Limits   Bladder:                Seen
Heart:                 Limited Views          Spine:                  Suboptimal views
RVOT:                  Normal appearance      Upper Extremities:      Visualized
LVOT:                  Normal appearance      Lower Extremities:      Visualized
Aortic Arch:           Normal appearance
CERVIX UTERUS ADNEXA:
Cervix
Length:           3.19  cm.
COMMENTS:
INDICATION: subutex exposure detailed anatomy
Thank you for referring your patient, Kiri Jim for
detailed anatomic survey due to subutex use.  She declined
MFM consultation today due to previous counseling/ neonatal
experience with subutex.   Dating is by ultrasound performed
at [REDACTED] on 03/14/16; measurements were consistent
with 10 weeks 0 days.
Ultrasound demonstrates a single, live intrauterine pregnancy
at 19 weeks 6 days.  The fetal spine is suboptimally imaged
(due to spine down positioning, but appears grossly normal).
The remainder of the detailed anatomic survey is
unremarkable.     The amniotic fluid volume is normal and
active fetal movements are seen.
Recommend follow up fetal growth and assessment of the
fetal spine in approximately 4 weeks

[Series 1: us mfm ob detail+14 wk · 0.22mm/px · 13 of 61 slices shown]
[im 3/61]
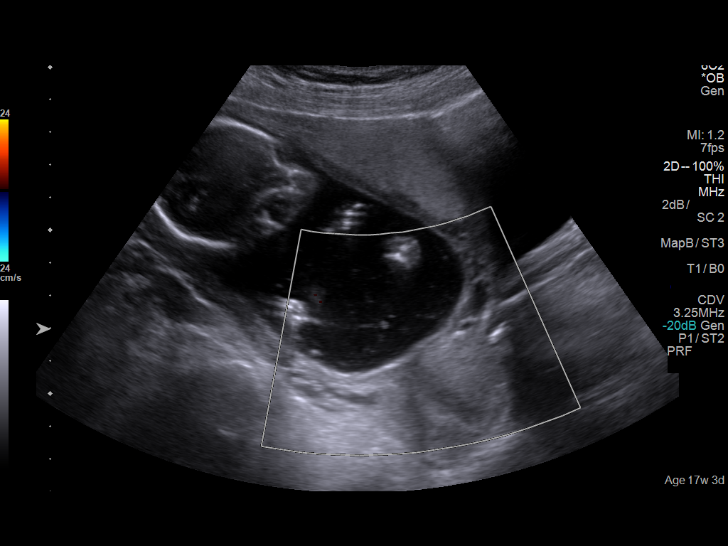
[im 7/61]
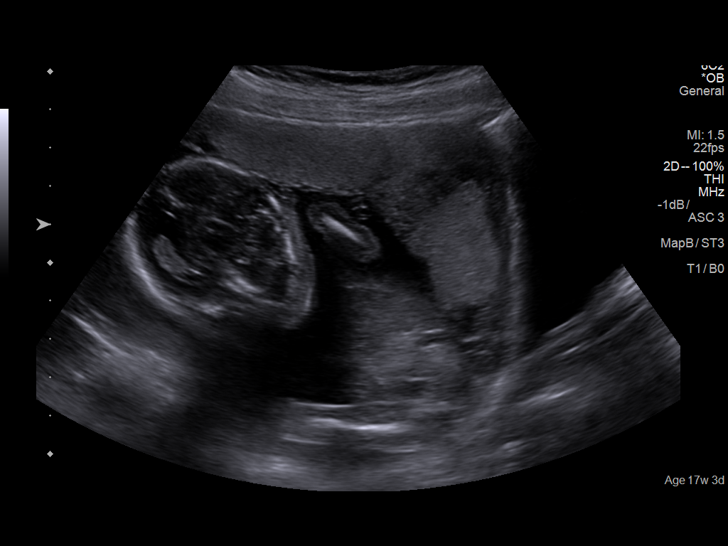
[im 12/61]
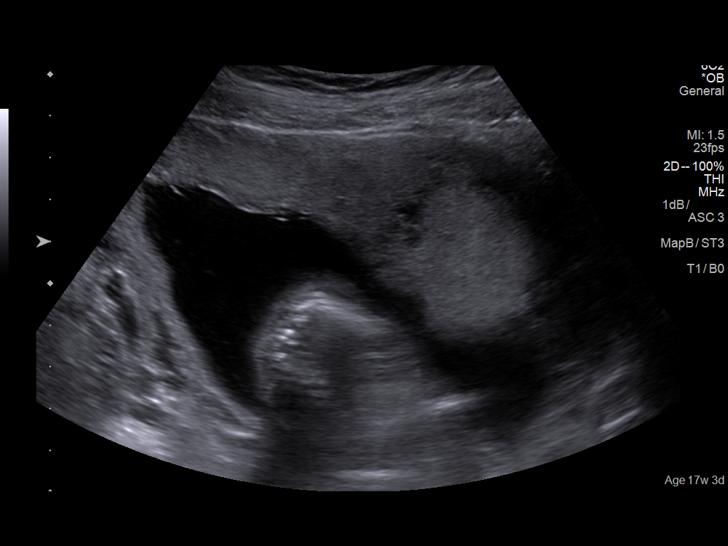
[im 16/61]
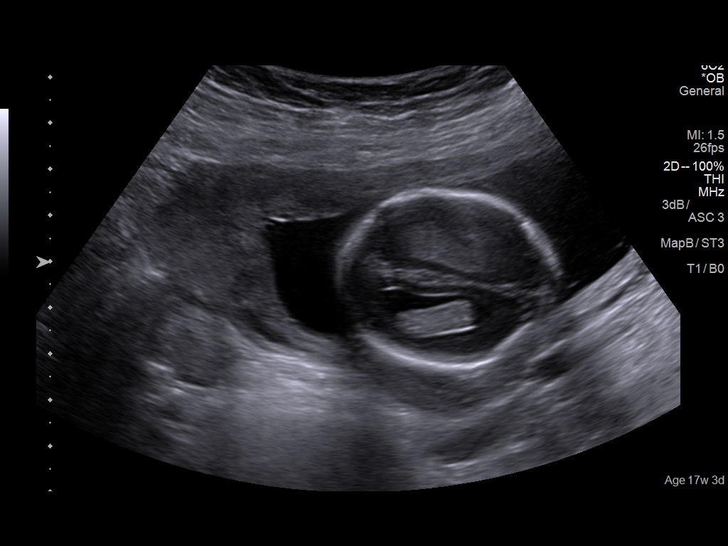
[im 21/61]
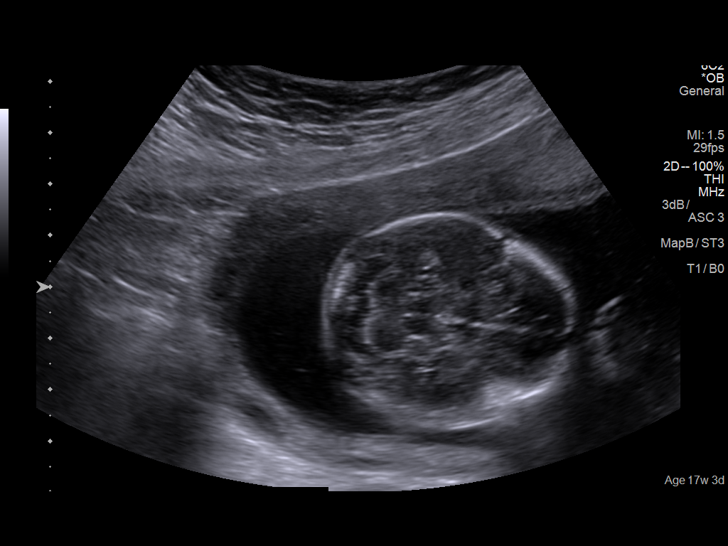
[im 25/61]
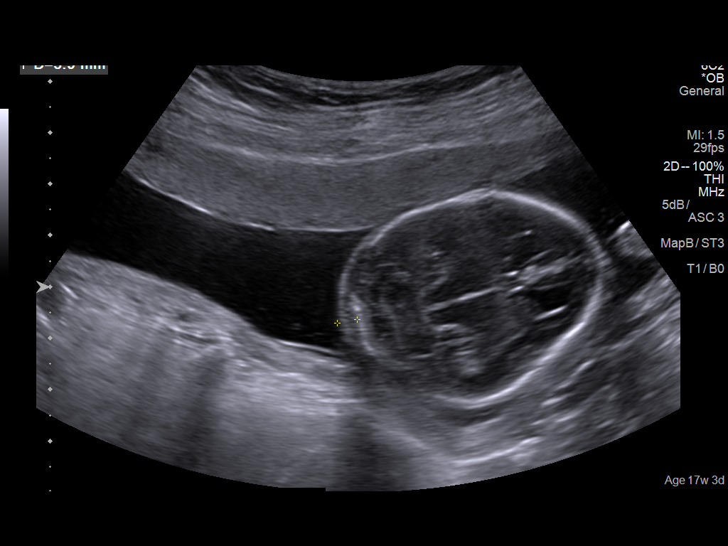
[im 32/61]
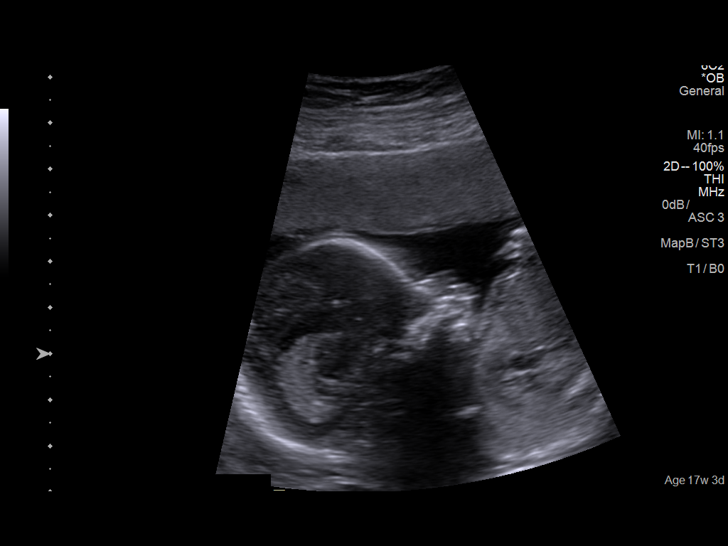
[im 36/61]
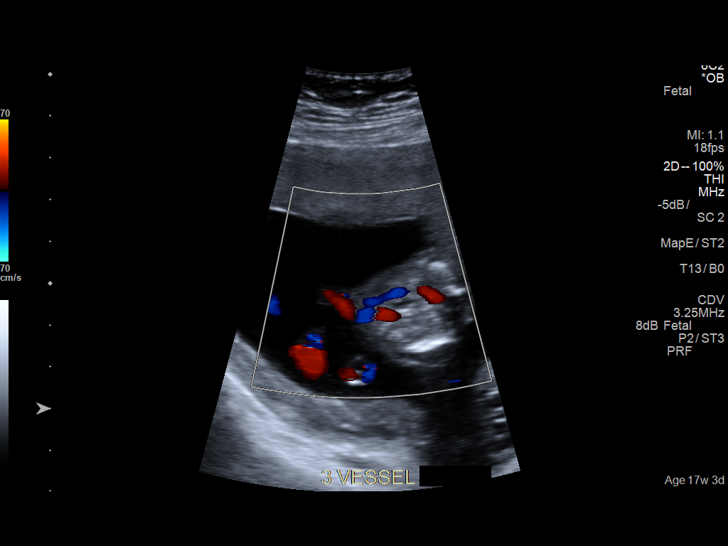
[im 41/61]
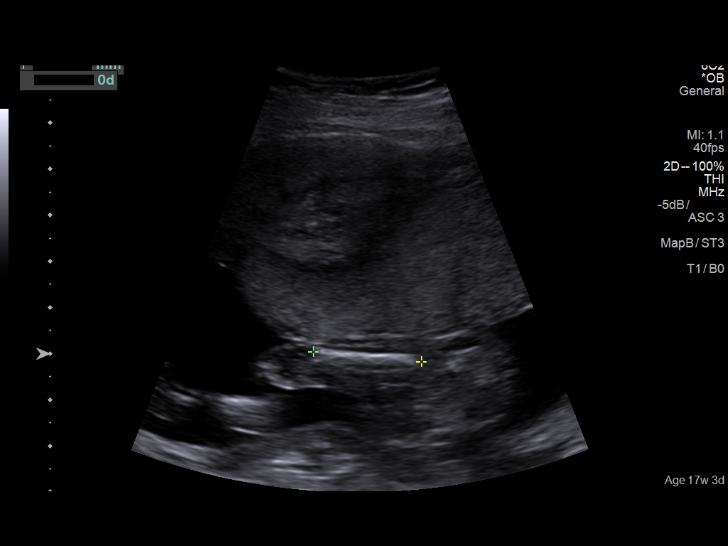
[im 45/61]
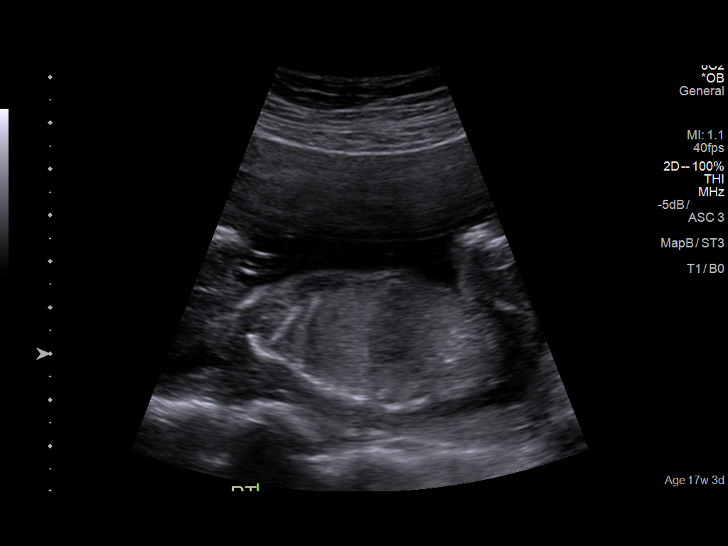
[im 49/61]
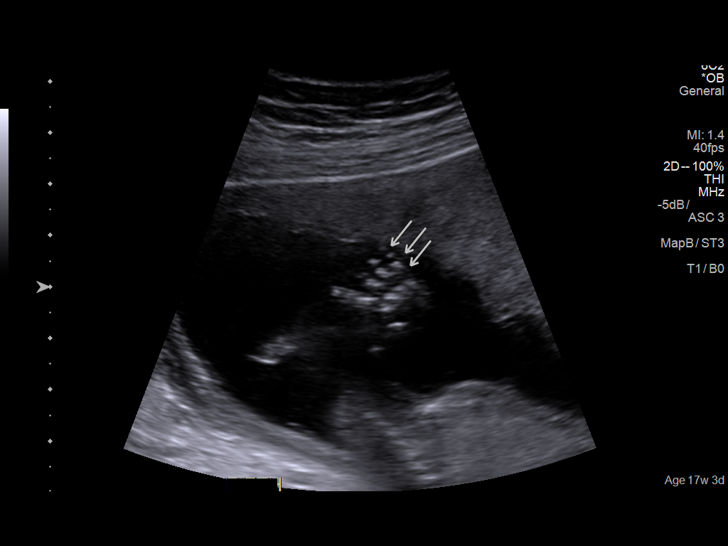
[im 54/61]
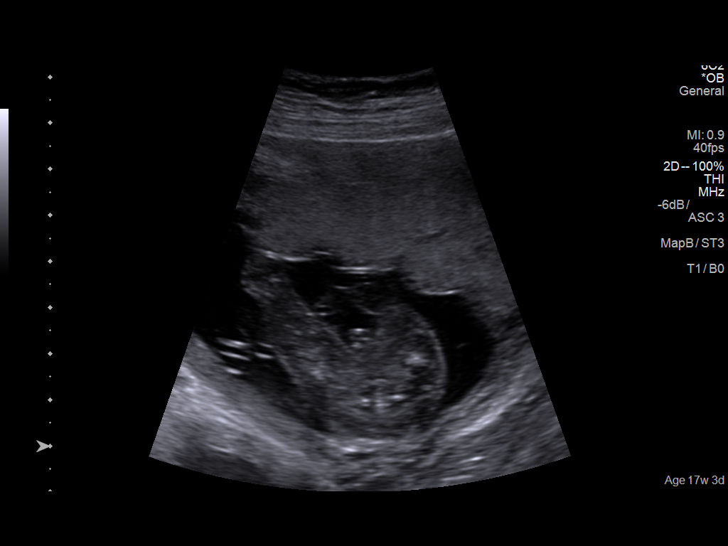
[im 58/61]
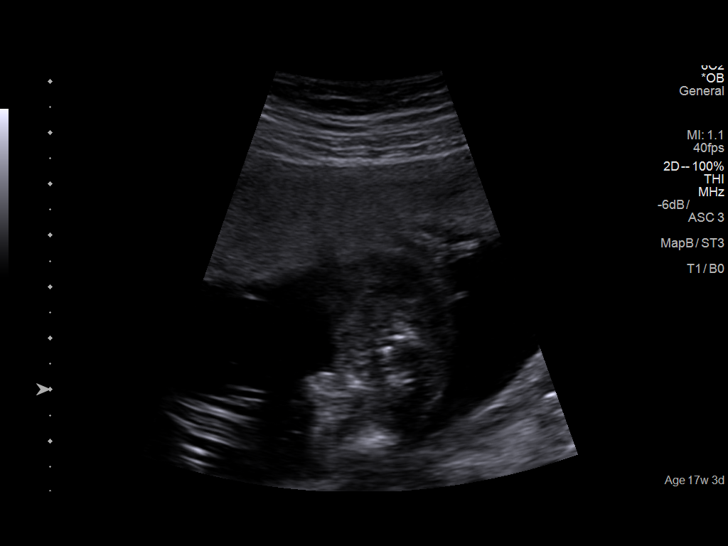

[13 of 28 positions shown; findings below may reference images not displayed]

## 2017-07-18 NOTE — L&D Delivery Note (Signed)
Delivery Summary for Revonda Humphreyachel E Belger  Labor Events:   Preterm labor:   Rupture date:   Rupture time:   Rupture type:   Fluid Color:   Induction:   Augmentation:   Complications:   Cervical ripening:          Delivery:   Episiotomy:   Lacerations:   Repair suture:   Repair # of packets:   Blood loss (ml):    Information for the patient's newborn:  Gerrit HeckMcCormick, Girl Tyson [[id number]][[id number]]    Delivery 03/13/2018 2:07 AM by  Vaginal, Spontaneous Sex:  female Gestational Age: 7039w4d Delivery Clinician:   Living?:         APGARS  One minute Five minutes Ten minutes  Skin color:        Heart rate:        Grimace:        Muscle tone:        Breathing:        Totals:         Presentation/position:      Resuscitation:   Cord information:    Disposition of cord blood:     Blood gases sent?  Complications:   Placenta: Delivered:       appearance Newborn Measurements: Weight: 6 lb 15 oz (3147 g)  Height:    Head circumference:    Chest circumference:    Other providers:    Additional  information: Forceps:   Vacuum:   Breech:   Observed anomalies        Delivery Note At 2:07 AM a viable and healthy female was delivered via Vaginal, Spontaneous (Presentation: Vertex; position unknown).  Birth occurred in patient's home, unplanned. APGAR: unknown 1 and 5 minutes; weight 6 lb 15 oz (3147 g).   Placenta status: intact.  Cord: 3-vessel with the following complications:   Cord pH: not obtained.   Anesthesia:  None Episiotomy:  None Lacerations:  None Suture Repair: none Est. Blood Loss (mL):  Unknown.  Mom to postpartum.  Baby to Couplet care / Skin to Skin.  Hildred Lasernika Taffie Eckmann 03/13/2018, 4:45 AM

## 2018-03-07 ENCOUNTER — Other Ambulatory Visit: Payer: Self-pay

## 2018-03-07 ENCOUNTER — Emergency Department
Admission: EM | Admit: 2018-03-07 | Discharge: 2018-03-07 | Disposition: A | Discharge status: Home / Self Care | Payer: Medicaid Other | Attending: Emergency Medicine | Admitting: Emergency Medicine

## 2018-03-07 DIAGNOSIS — Z3490 Encounter for supervision of normal pregnancy, unspecified, unspecified trimester: Secondary | ICD-10-CM

## 2018-03-07 DIAGNOSIS — F329 Major depressive disorder, single episode, unspecified: Secondary | ICD-10-CM | POA: Insufficient documentation

## 2018-03-07 DIAGNOSIS — F32A Depression, unspecified: Secondary | ICD-10-CM

## 2018-03-07 DIAGNOSIS — F17210 Nicotine dependence, cigarettes, uncomplicated: Secondary | ICD-10-CM | POA: Insufficient documentation

## 2018-03-07 DIAGNOSIS — F332 Major depressive disorder, recurrent severe without psychotic features: Secondary | ICD-10-CM

## 2018-03-07 LAB — COMPREHENSIVE METABOLIC PANEL
ALT: 14 U/L (ref 0–44)
AST: 20 U/L (ref 15–41)
Albumin: 2.4 g/dL — ABNORMAL LOW (ref 3.5–5.0)
Alkaline Phosphatase: 131 U/L — ABNORMAL HIGH (ref 38–126)
Anion gap: 9 (ref 5–15)
BUN: <5 mg/dL — ABNORMAL LOW (ref 6–20)
CO2: 19 mmol/L — AB (ref 22–32)
Calcium: 8.6 mg/dL — ABNORMAL LOW (ref 8.9–10.3)
Chloride: 109 mmol/L (ref 98–111)
Creatinine, Ser: 0.46 mg/dL (ref 0.44–1.00)
Glucose, Bld: 167 mg/dL — ABNORMAL HIGH (ref 70–99)
POTASSIUM: 3.3 mmol/L — AB (ref 3.5–5.1)
SODIUM: 137 mmol/L (ref 135–145)
Total Bilirubin: 0.3 mg/dL (ref 0.3–1.2)
Total Protein: 6.1 g/dL — ABNORMAL LOW (ref 6.5–8.1)

## 2018-03-07 LAB — POCT PREGNANCY, URINE: Preg Test, Ur: POSITIVE — AB

## 2018-03-07 LAB — CBC
HCT: 34.9 % — ABNORMAL LOW (ref 35.0–47.0)
HEMOGLOBIN: 11.8 g/dL — AB (ref 12.0–16.0)
MCH: 27.4 pg (ref 26.0–34.0)
MCHC: 33.9 g/dL (ref 32.0–36.0)
MCV: 80.8 fL (ref 80.0–100.0)
Platelets: 238 10*3/uL (ref 150–440)
RBC: 4.32 MIL/uL (ref 3.80–5.20)
RDW: 14.5 % (ref 11.5–14.5)
WBC: 7.9 10*3/uL (ref 3.6–11.0)

## 2018-03-07 LAB — URINE DRUG SCREEN, QUALITATIVE (ARMC ONLY)
AMPHETAMINES, UR SCREEN: POSITIVE — AB
Barbiturates, Ur Screen: NOT DETECTED
COCAINE METABOLITE, UR ~~LOC~~: NOT DETECTED
Cannabinoid 50 Ng, Ur ~~LOC~~: NOT DETECTED
MDMA (ECSTASY) UR SCREEN: NOT DETECTED
METHADONE SCREEN, URINE: NOT DETECTED
Opiate, Ur Screen: NOT DETECTED
Phencyclidine (PCP) Ur S: NOT DETECTED
Tricyclic, Ur Screen: NOT DETECTED

## 2018-03-07 LAB — ETHANOL

## 2018-03-07 LAB — HCG, QUANTITATIVE, PREGNANCY: hCG, Beta Chain, Quant, S: 52056 m[IU]/mL — ABNORMAL HIGH (ref <5)

## 2018-03-07 MED ORDER — SERTRALINE HCL 50 MG PO TABS: 50.0000 mg | ORAL_TABLET | Freq: Every day | ORAL | 1 refills | Status: AC

## 2018-03-07 NOTE — ED Notes (Signed)
FIRST NURSE NOTE:  Pt here for help with substance abuse problem.

## 2018-03-07 NOTE — ED Notes (Addendum)
Report received from Pam, EDT. Vitals rechecked; patient is currently resting at this time. Will continue to monitor.  

## 2018-03-07 NOTE — ED Notes (Signed)
Pt keeping blue bag here at hospital, cell phone, ,shoes, purse.  Diana FillersFriend, [name] taking home clothing.

## 2018-03-07 NOTE — ED Notes (Signed)

## 2018-03-07 NOTE — ED Notes (Signed)
BEHAVIORAL HEALTH ROUNDING Patient sleeping: No. Patient alert and oriented: yes Behavior appropriate: Yes.  ; If no, describe:  Nutrition and fluids offered: yes Toileting and hygiene offered: Yes  Sitter present: q15 minute observations and security monitoring Law enforcement present: Yes    

## 2018-03-07 NOTE — Consult Note (Signed)
[facility] Consult   Reason for Consult: Consult for this 27 year old woman came voluntarily to the emergency room seeking treatment for depression Referring Physician: Siadecki Patient Identification: Diana Richard MRN:  [id number] Principal Diagnosis: Severe recurrent major depression without psychotic features [facility]) Diagnosis:   Patient Active Problem List   Diagnosis Date Noted  . Severe recurrent major depression without psychotic features ([place]) [F33.2] 03/07/2018  . Pregnancy [Z34.90] 03/07/2018  . Postoperative state [Z98.890] 10/05/2016  . Pregnancy affected by fetal growth restriction [O36.5990] 10/04/2016  . Labor and delivery, indication for care [O75.9] 06/30/2015  . Indication for care or intervention related to labor and delivery [O75.9] 06/30/2015  . Variable fetal heart rate decelerations, antepartum [O36.8390] 06/03/2015  . First trimester screening [Z36.9] 12/22/2014    Total Time spent with patient: 1 hour  Subjective:   Diana Richard is a 27 y.o. female patient admitted with "I am worried about my mental health".  HPI: Patient seen chart reviewed.  Patient came voluntarily to the emergency room.  She says that today she was feeling completely overwhelmed and was crying at home.  Her grandmother saw that she had been feeling much worse and recommended she come to the hospital.  Patient says she is been feeling depressed and down probably for as much as 6 months but the last 2-3 months things have been getting distinctly worse.  She sleeps poorly at night with a lot of early awakening.  Her energy level is low.  Appetite had been decreased.  Feels little motivation and also feels irritable and loses her temper easily.  Denies having any wish to die or kill herself although vague thoughts of wishing she were "not here" have passed through her head.  No homicidal ideation.  Not having any psychotic symptoms.  Not currently using alcohol or drugs  although she has had substance abuse problems in the past.  She is living with her grandmother and has 78 young children already at home.  Just discovered she was pregnant a few weeks ago.  Not receiving any mental health treatment.  Social history: Not working.  Husband is in jail.  Patient has 3 young children already at home and is pregnant living with her grandmother.  Medical history: Intrauterine pregnancy no known complications at this point.  Complains of chronic back pain.  Substance abuse history: Past history of abuse of opiates denies that she is been using alcohol or drugs recently.  Past Psychiatric History: No past history of suicide attempts no history of violence.  Used to see a therapist and saw a psychiatrist.  Had been on medicine in the past but cannot remember exactly what it was.  Does not describe any episodes of violence or real mania.  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 How many times?: 0 Other Self Harm Risks: Reports of none Triggers for Past Attempts: None known Intentional Self Injurious Behavior: None Risk to Others: Homicidal Ideation: No Thoughts of Harm to Others: No Current Homicidal Intent: No Current Homicidal Plan: No Access to Homicidal Means: No Identified Victim: Reports of none History of harm to others?: No Assessment of Violence: None Noted Violent Behavior Description: Reports of none Does patient have access to weapons?: No Criminal Charges Pending?: No Does patient have a court date: No Prior  Inpatient Therapy: Prior Inpatient Therapy: No Prior Outpatient Therapy: Prior Outpatient Therapy: Yes Prior Therapy Dates: 07/2017 Prior Therapy Facilty/Provider(s): [facility] Reason for Treatment: Depression Does  patient have an ACCT team?: No Does patient have Intensive In-House Services?  : No Does patient have [facility] services? : No Does patient have P4CC services?: No  Past Medical History:  Past Medical History:  Diagnosis Date  . Anxiety   . Depression     Past Surgical History:  Procedure Laterality Date  . CESAREAN SECTION N/A 10/04/2016   Procedure: CESAREAN SECTION;  Surgeon: Boykin Nearing, MD;  Location: [facility] ORS;  Service: Obstetrics;  Laterality: N/A;  . LUMBAR LAMINECTOMY/DECOMPRESSION MICRODISCECTOMY Left 10/11/2012   Procedure: LUMBAR LAMINECTOMY/DECOMPRESSION MICRODISCECTOMY 1 LEVEL;  Surgeon: Eustace Moore, MD;  Location: [place] [facility];  Service: Neurosurgery;  Laterality: Left;  left lumbar four-five  . LUMBAR LAMINECTOMY/DECOMPRESSION MICRODISCECTOMY Right 06/07/2013   Procedure: Right Lumbar five-sacral one microdiskectomy ;  Surgeon: Eustace Moore, MD;  Location: [place] [facility];  Service: Neurosurgery;  Laterality: Right;  Right Lumbar five-sacral one microdiskectomy   . TONSILLECTOMY     Family History: No family history on file. 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 Marital status: Single    Spouse name: Not on file  . Number of children: Not on file  . Years of education: Not on file  . Highest education level: Not on file  Occupational History  . Not on file  Social Needs  . Financial resource strain: Not on file  . Food insecurity:    Worry: Not on file    Inability: Not on file  . Transportation needs:    Medical: Not on file    Non-medical: Not on file  Tobacco Use  . Smoking status: Current Every Day Smoker    Packs/day: 0.25    Years: 6.00    Pack years: 1.50    Types:  Cigarettes  . Smokeless tobacco: Never Used  Substance and Sexual Activity  . Alcohol use: No  . 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 Sexual activity: Yes  Lifestyle  . Physical activity:    Days per week: Not on file    Minutes per session: Not on file  . Stress: Not on file  Relationships  . Social connections:    Talks on phone: Not on file    Gets together: Not on file    Attends religious service: Not on file    Active member of club or organization: Not on file    Attends meetings of clubs or organizations: Not on file    Relationship status: Not on file  Other Topics Concern  . Not on file  Social History Narrative  . Not on file   Additional Social History:    Allergies:  No Known Allergies  Labs:  Results for orders placed or performed during the hospital encounter of 03/07/18 (from the past 48 hour(s))  Comprehensive metabolic panel     Status: Abnormal   Collection Time: 03/07/18  4:07 PM  Result Value Ref Range   Sodium 137 135 - 145 mmol/L   Potassium 3.3 (L)  3.5 - 5.1 mmol/L   Chloride 109 98 - 111 mmol/L   CO2 19 (L) 22 - 32 mmol/L   Glucose, Bld 167 (H) 70 - 99 mg/dL   BUN <5 (L) 6 - 20 mg/dL   Creatinine, Ser 0.46 0.44 - 1.00 mg/dL   Calcium 8.6 (L) 8.9 - 10.3 mg/dL   Total Protein 6.1 (L) 6.5 - 8.1 g/dL   Albumin 2.4 (L) 3.5 - 5.0 g/dL   AST 20 15 - 41 U/L   ALT 14 0 - 44 U/L   Alkaline Phosphatase 131 (H) 38 - 126 U/L   Total Bilirubin 0.3 0.3 - 1.2 mg/dL   GFR calc non Af Amer >60 >60 mL/min   GFR calc Af Amer >60 >60 mL/min    Comment: (NOTE) The eGFR has been calculated using the CKD EPI equation. This calculation has not been validated in all clinical situations. eGFR's persistently <60 mL/min signify possible Chronic Kidney Disease.    Anion gap 9 5 - 15    Comment: Performed at [facility], [address] [place], [place] [address]  Ethanol     Status: None   Collection Time: 03/07/18  4:07 PM    Result Value Ref Range   Alcohol, Ethyl (B) <10 <10 mg/dL    Comment: (NOTE) Lowest detectable limit for serum alcohol is 10 mg/dL. For medical purposes only. Performed at [facility], [address] [place], [place] [address]   cbc     Status: Abnormal   Collection Time: 03/07/18  4:07 PM  Result Value Ref Range   WBC 7.9 3.6 - 11.0 K/uL   RBC 4.32 3.80 - 5.20 MIL/uL   Hemoglobin 11.8 (L) 12.0 - 16.0 g/dL   HCT 34.9 (L) 35.0 - 47.0 %   MCV 80.8 80.0 - 100.0 fL   MCH 27.4 26.0 - 34.0 pg   MCHC 33.9 32.0 - 36.0 g/dL   RDW 14.5 11.5 - 14.5 %   Platelets 238 150 - 440 K/uL    Comment: Performed at [facility], [address], [place], [place] [address]  Urine Drug Screen, Qualitative     Status: Abnormal   Collection Time: 03/07/18  4:07 PM  Result Value Ref Range   Tricyclic, Ur Screen NONE DETECTED NONE DETECTED   Amphetamines, Ur Screen POSITIVE (A) NONE DETECTED   MDMA (Ecstasy)Ur Screen NONE DETECTED NONE DETECTED   Cocaine Metabolite,Ur [place] NONE DETECTED NONE DETECTED   Opiate, Ur Screen NONE DETECTED NONE DETECTED   Phencyclidine (PCP) Ur S NONE DETECTED NONE DETECTED   Cannabinoid 50 Ng, Ur [place] NONE DETECTED NONE DETECTED   Barbiturates, Ur Screen NONE DETECTED NONE DETECTED   Benzodiazepine, Ur Scrn TEST NOT PERFORMED, REAGENT NOT AVAILABLE (A) NONE DETECTED   Methadone Scn, Ur NONE DETECTED NONE DETECTED    Comment: (NOTE) Tricyclics + metabolites, urine    Cutoff 1000 ng/mL Amphetamines + metabolites, urine  Cutoff 1000 ng/mL MDMA (Ecstasy), urine              Cutoff 500 ng/mL Cocaine Metabolite, urine          Cutoff 300 ng/mL Opiate + metabolites, urine        Cutoff 300 ng/mL Phencyclidine (PCP), urine         Cutoff 25 ng/mL Cannabinoid, urine                 Cutoff 50 ng/mL Barbiturates + metabolites, urine  Cutoff 200 ng/mL Benzodiazepine,  urine              Cutoff 200 ng/mL Methadone, urine                   Cutoff 300 ng/mL The urine  drug screen provides only a preliminary, unconfirmed analytical test result and should not be used for non-medical purposes. Clinical consideration and professional judgment should be applied to any positive drug screen result due to possible interfering substances. A more specific alternate chemical method must be used in order to obtain a confirmed analytical result. Gas chromatography / mass spectrometry (GC/MS) is the preferred confirmat ory method. Performed at [facility], [address] [place], [place] [address]   hCG, quantitative, pregnancy     Status: Abnormal   Collection Time: 03/07/18  4:07 PM  Result Value Ref Range   hCG, Beta Chain, Quant, S 52,056 (H) <5 mIU/mL    Comment:          GEST. AGE      CONC.  (mIU/mL)   <=1 WEEK        5 - 50     2 WEEKS       50 - 500     3 WEEKS       100 - 10,000     4 WEEKS     1,000 - 30,000     5 WEEKS     3,500 - 115,000   6-8 WEEKS     12,000 - 270,000    12 WEEKS     15,000 - 220,000        FEMALE AND NON-PREGNANT FEMALE:     LESS THAN 5 mIU/mL Performed at [facility], [address] [place], [place] [address]   Pregnancy, urine POC     Status: Abnormal   Collection Time: 03/07/18  4:26 PM  Result Value Ref Range   Preg Test, Ur POSITIVE (A) NEGATIVE    Comment:        THE SENSITIVITY OF THIS METHODOLOGY IS >24 mIU/mL     No current facility-administered medications for this encounter.    Current Outpatient Medications  Medication Sig Dispense Refill  . acetaminophen-codeine (TYLENOL #3) 300-30 MG tablet Take 1-2 tablets by mouth every 6 (six) hours as needed for moderate pain. 30 tablet 0  . buprenorphine (SUBUTEX) 8 MG SUBL SL tablet Place 8 mg under the tongue daily.    Marland Kitchen gabapentin (NEURONTIN) 800 MG tablet Take 1 tablet (800 mg total) by mouth at bedtime. 3 tablet 0  . ibuprofen (ADVIL,MOTRIN) 600 MG tablet Take 1 tablet (600 mg total) by mouth every 6 (six) hours as needed. 60 tablet 0    . norethindrone (ORTHO MICRONOR) 0.35 MG tablet Take 1 tablet (0.35 mg total) by mouth daily. (Patient not taking: Reported on 10/04/2016) 1 Package 11  . Prenatal Vit-Fe Fumarate-FA (PRENATAL MULTIVITAMIN) TABS tablet Take 1 tablet by mouth daily at 12 noon.    . sertraline (ZOLOFT) 50 MG tablet Take 1 tablet (50 mg total) by mouth daily. 30 tablet 1    Musculoskeletal: Strength & Muscle Tone: within normal limits Gait & Station: normal Patient leans: N/A  Psychiatric Specialty Exam: Physical Exam  Nursing note and vitals reviewed. Constitutional: She appears well-developed and well-nourished.  HENT:  Head: Normocephalic and atraumatic.  Eyes: Pupils are equal, round, and reactive to light. Conjunctivae are normal.  Neck: Normal range of motion.  Cardiovascular: Regular rhythm and normal heart sounds.  Respiratory: Effort normal. No respiratory distress.  GI: Soft.    Musculoskeletal: Normal range of motion.  Neurological: She is alert.  Skin: Skin is warm and dry.  Psychiatric: Judgment normal. Her affect is blunt. Her speech is delayed. She is slowed. Thought content is not paranoid. Cognition and memory are normal. She exhibits a depressed mood. She expresses no homicidal and no suicidal ideation.    Review of Systems  Psychiatric/Behavioral: Positive for depression. Negative for hallucinations, memory loss, substance abuse and suicidal ideas. The patient is nervous/anxious and has insomnia.     Blood pressure (!) 144/102, pulse (!) 122, temperature 98.8 F (37.1 C), temperature source Oral, resp. rate 18, height 5' 7" (1.702 m), weight 74.8 kg, SpO2 99 %, unknown if currently breastfeeding.Body mass index is 25.84 kg/m.  General Appearance: Disheveled  Eye Contact:  Good  Speech:  Normal Rate  Volume:  Decreased  Mood:  Depressed  Affect:  Blunt  Thought Process:  Goal Directed  Orientation:  Full (Time, Place, and Person)  Thought Content:  Logical  Suicidal  Thoughts:  No  Homicidal Thoughts:  No  Memory:  Immediate;   Fair Recent;   Fair Remote;   Fair  Judgement:  Fair  Insight:  Fair  Psychomotor Activity:  Decreased  Concentration:  Concentration: Fair  Recall:  [employer] of Knowledge:  Fair  Language:  Fair  Akathisia:  No  Handed:  Right  AIMS (if indicated):     Assets:  Communication Skills Desire for Improvement Housing Resilience Social Support  ADL's:  Intact  Cognition:  WNL  Sleep:        Treatment Plan Summary: Medication management and Plan 27 year old woman who reports multiple symptoms of major depression.  Not psychotic.  Not acutely suicidal.  Patient is interested in and willing to pursue appropriate treatment and has good insight.  Does not require inpatient hospitalization.  She and I discussed the risks and benefits of medication treatment of depression during pregnancy including the potential risk of under developed pulmonary function in unborn children.  Also discussed the negative outcome of depression during pregnancy.  Patient is wanting to start medication.  Recommend sertraline 50 mg a day.  Side effects reviewed.  Prescription written.  Patient encouraged to immediately seek out an OB/GYN for pre-natal care and also get into see a psychiatrist.  She will be given referral to local providers.  Case reviewed with ER doctor and TTS.  Disposition: No evidence of imminent risk to self or others at present.   Patient does not meet criteria for psychiatric inpatient admission. Supportive therapy provided about ongoing stressors. Discussed crisis plan, support from social network, calling 911, coming to the Emergency Department, and calling Suicide Hotline.  Alethia Berthold, MD 03/07/2018 7:10 PM

## 2018-03-07 NOTE — BH Assessment (Signed)
Assessment Note  Diana HumphreyRachel E Richard is an 27 y.o. female who presents to the ER seeking assistance for her symptoms of depression. She states she's felt overwhelmed and anxious. She have limited motivation to complete her tasks and do things at her home. Patient is currently pregnant and have three younger children. She shared, she had dealt with depression throughout her life but didn't realize how bad it has gotten. She initially thought she was lethargic due to caring for her children and grandmother. However, now she have limited to no motivation to get out of her bed.  Patient states she was followed by Dr. Janeece RiggersSu (Psychiatrist), at [facility] but she had to stop going because she had no transportation. 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 She started using November 2018 up till approximately a month ago.  During the interview the patient was calm, cooperative and pleasant. She was able to provide appropriate answers to the questions. She denies current use of any mind-altering substance and no involvement with the legal system. She also denies past and current aggressive behaviors.  Diagnosis: Depression  Past Medical History:  Past Medical History:  Diagnosis Date  . Anxiety   . Depression     Past Surgical History:  Procedure Laterality Date  . CESAREAN SECTION N/A 10/04/2016   Procedure: CESAREAN SECTION;  Surgeon: Suzy Bouchardhomas J Schermerhorn, MD;  Location: [facility] ORS;  Service: Obstetrics;  Laterality: N/A;  . LUMBAR LAMINECTOMY/DECOMPRESSION MICRODISCECTOMY Left 10/11/2012   Procedure: LUMBAR LAMINECTOMY/DECOMPRESSION MICRODISCECTOMY 1 LEVEL;  Surgeon: Tia Alertavid S Jones, MD;  Location: [facility];  Service: Neurosurgery;  Laterality: Left;  left lumbar four-five  . LUMBAR LAMINECTOMY/DECOMPRESSION MICRODISCECTOMY Right 06/07/2013   Procedure: Right Lumbar five-sacral one microdiskectomy ;  Surgeon: Tia Alertavid S  Jones, MD;  Location: [facility];  Service: Neurosurgery;  Laterality: Right;  Right Lumbar five-sacral one microdiskectomy   . TONSILLECTOMY      Family History: No family history on file.  Social History:  reports that she has been smoking cigarettes. She has a 1.50 pack-year smoking history. She has never used smokeless tobacco. She reports that she does not drink alcohol or use drugs.  Additional Social History:  Alcohol / Drug Use Pain Medications: See PTA Prescriptions: See PTA Over the Counter: See PTA History of alcohol / drug use?: Yes Longest period of sobriety (when/how long): Month Negative Consequences of Use: Personal relationships Substance #1 Name of Substance 1: Methamphetamine 1 - Age of First Use: 26 1 - Amount (size/oz): "1 gram" 1 - Frequency: Daily 1 - Duration: Approximately 8 months 1 - Last Use / Amount: 01/2018  CIWA: CIWA-Ar BP: (!) 144/102 Pulse Rate: (!) 122 COWS:    Allergies: No Known Allergies  Home Medications:  (Not in a hospital admission)  OB/GYN Status:  No LMP recorded. Patient is pregnant.  General Assessment Data [facility] Assessment: In system Is this a Tele or Face-to-Face Assessment?: Face-to-Face Is this an Initial Assessment or a Re-assessment for this encounter?: Initial Assessment Marital status: Married [place] Kathlene NovemberMcCormick Is patient pregnant?: Yes Pregnancy Status: Yes (Comment: include estimated delivery date) Living Arrangements: Other relatives, Children Can pt return to current living arrangement?: Yes Admission Status: Voluntary Is patient capable of signing voluntary admission?: Yes Referral Source: Self/Family/Friend Insurance type: Medicaid  Medical Screening Exam [facility]-in ONLY) Medical Exam completed: Yes  Crisis Care Plan Living Arrangements: Other relatives,  Children Legal Guardian: Other:(Self) Name of Psychiatrist: Reports of none Name of Therapist: Reports of  none  Education Status Is patient currently in school?: No Is the patient employed, unemployed or receiving disability?: Unemployed  Risk to self with the past 6 months Suicidal Ideation: No-Not Currently/Within Last 6 Months Has patient been a risk to self within the past 6 months prior to admission? : No Suicidal Intent: No Has patient had any suicidal intent within the past 6 months prior to admission? : No Is patient at risk for suicide?: No Suicidal Plan?: No Has patient had any suicidal plan within the past 6 months prior to admission? : No Access to Means: No What has been your use of drugs/alcohol within the last 12 months?: Methamphetamine Previous Attempts/Gestures: No How many times?: 0 Other Self Harm Risks: Reports of none Triggers for Past Attempts: None known Intentional Self Injurious Behavior: None Family Suicide History: Unknown Recent stressful life event(s): Loss (Comment), Financial Problems Persecutory voices/beliefs?: No Depression: Yes Depression Symptoms: Insomnia, Isolating, Fatigue, Guilt, Loss of interest in usual pleasures, Feeling worthless/self pity Substance abuse history and/or treatment for substance abuse?: Yes Suicide prevention information given to non-admitted patients: Not applicable  Risk to Others within the past 6 months Homicidal Ideation: No Does patient have any lifetime risk of violence toward others beyond the six months prior to admission? : No Thoughts of Harm to Others: No Current Homicidal Intent: No Current Homicidal Plan: No Access to Homicidal Means: No Identified Victim: Reports of none History of harm to others?: No Assessment of Violence: None Noted Violent Behavior Description: Reports of none Does patient have access to weapons?: No Criminal Charges Pending?: No Does patient have a court date: No Is patient on probation?: No  Psychosis Hallucinations: None noted Delusions: None noted  Mental Status  Report Appearance/Hygiene: Unremarkable, In scrubs Eye Contact: Good Motor Activity: Unable to assess(Patient laying in the bed) Speech: Logical/coherent, Unremarkable Level of Consciousness: Alert Mood: Depressed, Sad, Pleasant Affect: Appropriate to circumstance, Depressed, Sad Anxiety Level: Minimal Thought Processes: Coherent, Relevant Judgement: Unimpaired Orientation: Person, Place, Time, Situation, Appropriate for developmental age Obsessive Compulsive Thoughts/Behaviors: Minimal  Cognitive Functioning Concentration: Normal Memory: Recent Intact, Remote Intact Is patient IDD: No Is patient DD?: No Insight: Good Impulse Control: Good Appetite: Fair Have you had any weight changes? : No Change Sleep: Decreased Total Hours of Sleep: 3(Trouble staying asleep) Vegetative Symptoms: None  ADLScreening [facility] Assessment Services) Patient's cognitive ability adequate to safely complete daily activities?: Yes Patient able to express need for assistance with ADLs?: Yes Independently performs ADLs?: Yes (appropriate for developmental age)  Prior Inpatient Therapy Prior Inpatient Therapy: No  Prior Outpatient Therapy Prior Outpatient Therapy: Yes Prior Therapy Dates: 07/2017 Prior Therapy Facilty/Provider(s): [facility] Reason for Treatment: Depression Does patient have an ACCT team?: No Does patient have Intensive In-House Services?  : No Does patient have [facility] services? : No Does patient have P4CC services?: No  ADL Screening (condition at time of admission) Patient's cognitive ability adequate to safely complete daily activities?: Yes Is the patient deaf or have difficulty hearing?: No Does the patient have difficulty seeing, even when wearing glasses/contacts?: No Does the patient have difficulty concentrating, remembering, or making decisions?: No Patient able to express need for assistance with ADLs?: Yes Does the patient have difficulty  dressing or bathing?: No Independently performs ADLs?: Yes (appropriate for developmental age) Does the patient have difficulty walking or climbing stairs?: No Weakness of Legs: None Weakness of Arms/Hands:  None  Home Assistive Devices/Equipment Home Assistive Devices/Equipment: None  Therapy Consults (therapy consults require a physician order) PT Evaluation Needed: No OT Evalulation Needed: No SLP Evaluation Needed: No Abuse/Neglect Assessment (Assessment to be complete while patient is alone) Abuse/Neglect Assessment Can Be Completed: Yes Physical Abuse: Denies Verbal Abuse: Denies Sexual Abuse: Denies Exploitation of patient/patient's resources: Denies Self-Neglect: Denies Values / Beliefs Cultural Requests During Hospitalization: None Spiritual Requests During Hospitalization: None Consults Spiritual Care Consult Needed: No Social Work Consult Needed: No Merchant navy officerAdvance Directives (For Healthcare) Does Patient Have a Medical Advance Directive?: No       Child/Adolescent Assessment Running Away Risk: Denies(Patient is an adult)  Disposition:  Disposition Initial Assessment Completed for this Encounter: Yes  On Site Evaluation by:   Reviewed with Physician:    Lilyan Gilfordalvin J. Latravious Levitt MS, LCAS, LPC, NCC, CCSI Therapeutic Triage Specialist 03/07/2018 6:02 PM

## 2018-03-07 NOTE — ED Triage Notes (Addendum)
To ER via POV c/o depression for a few months. Pt reports that she has no motivation to take care of her kids, take care of house duties. Pt alert and oriented X4, active, cooperative, pt in NAD. RR even and unlabored, color WNL.   Reports thoughts of harming herself, but denies attempt, or plan.   Pt pregnant, unsure of LMP or due date. Has only taken home pregnancy test.

## 2018-03-07 NOTE — ED Notes (Signed)
Pt denies SI/HI/AVH. Pt given discharge instructions including prescriptions. Pt states understanding. Pt states receipt of all belongings.   

## 2018-03-07 NOTE — ED Provider Notes (Signed)
[facility] Emergency Department Provider Note ____________________________________________   First MD Initiated Contact with Patient 03/07/18 1631     (approximate)  I have reviewed the triage vital signs and the nursing notes.   HISTORY  Chief Complaint Depression    HPI Diana Richard is a 27 y.o. female with PMH as noted below who presents with depression, gradual onset, worsening over the last several weeks, and exacerbated by increased stress related to taking care of her kids and other duties in her household.  Patient has had thoughts of wanting to hurt herself, but has not attempted to do so, and has no specific plan.  She denies any prior history of suicide attempts or any psychiatric hospitalizations.  She was previously placed on medication for anxiety but states she no longer takes it.  She denies any acute medical complaints although she states she is pregnant.  She is unsure of her LMP, but denies any vaginal bleeding or abdominal pain.  Past Medical History:  Diagnosis Date  . Anxiety   . Depression     Patient Active Problem List   Diagnosis Date Noted  . Severe recurrent major depression without psychotic features (HCC) 03/07/2018  . Pregnancy 03/07/2018  . Postoperative state 10/05/2016  . Pregnancy affected by fetal growth restriction 10/04/2016  . Labor and delivery, indication for care 06/30/2015  . Indication for care or intervention related to labor and delivery 06/30/2015  . Variable fetal heart rate decelerations, antepartum 06/03/2015  . First trimester screening 12/22/2014    Past Surgical History:  Procedure Laterality Date  . CESAREAN SECTION N/A 10/04/2016   Procedure: CESAREAN SECTION;  Surgeon: Suzy Bouchardhomas J Schermerhorn, MD;  Location: [facility] ORS;  Service: Obstetrics;  Laterality: N/A;  . LUMBAR LAMINECTOMY/DECOMPRESSION MICRODISCECTOMY Left 10/11/2012   Procedure: LUMBAR LAMINECTOMY/DECOMPRESSION MICRODISCECTOMY 1  LEVEL;  Surgeon: Tia Alertavid S Jones, MD;  Location: [facility];  Service: Neurosurgery;  Laterality: Left;  left lumbar four-five  . LUMBAR LAMINECTOMY/DECOMPRESSION MICRODISCECTOMY Right 06/07/2013   Procedure: Right Lumbar five-sacral one microdiskectomy ;  Surgeon: Tia Alertavid S Jones, MD;  Location: [facility];  Service: Neurosurgery;  Laterality: Right;  Right Lumbar five-sacral one microdiskectomy   . TONSILLECTOMY      Prior to Admission medications   Medication Sig Start Date End Date Taking? Authorizing Provider  acetaminophen-codeine (TYLENOL #3) 300-30 MG tablet Take 1-2 tablets by mouth every 6 (six) hours as needed for moderate pain. 10/07/16   [name], Bethany, MD  buprenorphine (SUBUTEX) 8 MG SUBL SL tablet Place 8 mg under the tongue daily.    [provider]  gabapentin (NEURONTIN) 800 MG tablet Take 1 tablet (800 mg total) by mouth at bedtime. 10/07/16 10/10/16  [name], Bethany, MD  ibuprofen (ADVIL,MOTRIN) 600 MG tablet Take 1 tablet (600 mg total) by mouth every 6 (six) hours as needed. 07/02/15   Schermerhorn, Ihor Austinhomas J, MD  norethindrone (ORTHO MICRONOR) 0.35 MG tablet Take 1 tablet (0.35 mg total) by mouth daily. Patient not taking: Reported on 10/04/2016 07/02/15   Schermerhorn, Ihor Austinhomas J, MD  Prenatal Vit-Fe Fumarate-FA (PRENATAL MULTIVITAMIN) TABS tablet Take 1 tablet by mouth daily at 12 noon.    [provider]  sertraline (ZOLOFT) 50 MG tablet Take 1 tablet (50 mg total) by mouth daily. 03/07/18 05/06/18  Clapacs, Jackquline DenmarkJohn T, MD    Allergies Patient has no known allergies.  No family history on file.  Social History Social History   Tobacco Use  . Smoking status: Current  Every Day Smoker    Packs/day: 0.25    Years: 6.00    Pack years: 1.50    Types: Cigarettes  . Smokeless tobacco: Never Used  Substance Use Topics  . Alcohol use: No  . 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 Eyes: No redness. ENT: No sore throat. Cardiovascular: Denies chest pain. Respiratory: Denies shortness of breath. Gastrointestinal: No vomiting.  Genitourinary: Negative for vaginal bleeding.  Musculoskeletal: Negative for back pain. Skin: Negative for rash. Neurological: Negative for headaches.   ____________________________________________   PHYSICAL EXAM:  VITAL SIGNS: ED Triage Vitals  Enc Vitals Group     BP 03/07/18 1603 (!) 144/102     Pulse Rate 03/07/18 1603 (!) 122     Resp 03/07/18 1603 18     Temp 03/07/18 1603 98.8 F (37.1 C)     Temp Source 03/07/18 1603 Oral     SpO2 03/07/18 1603 99 %     Weight 03/07/18 1604 165 lb (74.8 kg)     Height 03/07/18 1604 5\' 7"  (1.702 m)     Head Circumference --      Peak Flow --      Pain Score 03/07/18 1604 0     Pain Loc --      Pain Edu? --      Excl. in GC? --     Constitutional: Alert and oriented. Well appearing and in no acute distress. Eyes: Conjunctivae are normal.  Head: Atraumatic. Nose: No congestion/rhinnorhea. Mouth/Throat: Mucous membranes are moist.   Neck: Normal range of motion.  Cardiovascular: Good peripheral circulation. Respiratory: Normal respiratory effort. Gastrointestinal:  No distention.  Musculoskeletal:  Extremities warm and well perfused.  Neurologic:  Normal speech and language. No gross focal neurologic deficits are appreciated.  Skin:  Skin is warm and dry. No rash noted. Psychiatric: Mood and affect are normal. Speech and behavior are normal.  ____________________________________________   LABS (all labs ordered are listed, but only abnormal results are displayed)  Labs Reviewed  COMPREHENSIVE METABOLIC PANEL - Abnormal; Notable for the following components:      Result Value   Potassium 3.3 (*)    CO2 19 (*)    Glucose, Bld 167 (*)    BUN <5 (*)    Calcium 8.6 (*)    Total Protein 6.1 (*)    Albumin 2.4 (*)    Alkaline Phosphatase 131 (*)    All other  components within normal limits  CBC - Abnormal; Notable for the following components:   Hemoglobin 11.8 (*)    HCT 34.9 (*)    All other components within normal limits  URINE DRUG SCREEN, QUALITATIVE ([facility] ONLY) - Abnormal; Notable for the following components:   Amphetamines, Ur Screen POSITIVE (*)    Benzodiazepine, Ur Scrn TEST NOT PERFORMED, REAGENT NOT AVAILABLE (*)    All other components within normal limits  HCG, QUANTITATIVE, PREGNANCY - Abnormal; Notable for the following components:   hCG, Beta Chain, Quant, S 52,056 (*)    All other components within normal limits  POCT PREGNANCY, URINE - Abnormal; Notable for the following components:   Preg Test, Ur POSITIVE (*)    All other components within normal limits  ETHANOL   ____________________________________________  EKG   ____________________________________________  RADIOLOGY    ____________________________________________   PROCEDURES  Procedure(s) performed: No  Procedures  Critical Care performed: No ____________________________________________   INITIAL  IMPRESSION / ASSESSMENT AND PLAN / ED COURSE  Pertinent labs & imaging results that were available during my care of the patient were reviewed by me and considered in my medical decision making (see chart for details).  27 year old female with history of depression and anxiety presents with worsening depression and suicidal ideation, however with no specific plan.  She states she has not attempted to hurt her self.  She denies any medical complaints.  On exam, the patient is comfortable appearing.  She was tachycardic in triage likely related to anxiety, however her other vital signs are normal.  The remainder of the exam is unremarkable.  We will obtain labs for medical clearance including an hCG, psychiatry consult, and reassess.  ----------------------------------------- 7:25 PM on 03/07/2018 -----------------------------------------  Lab  work-up is unremarkable.  The patient has been evaluated by Dr. Toni Amendlapacs, who has cleared her for discharge.  He is provided a prescription for an antidepressant.  We we will give her referral to [facility] for mental health follow-up, as well as to OB/GYN.  The patient is stable for discharge at this time.  Return precautions given. ____________________________________________   FINAL CLINICAL IMPRESSION(S) / ED DIAGNOSES  Final diagnoses:  Depression, unspecified depression type      NEW MEDICATIONS STARTED DURING THIS VISIT:  Current Discharge Medication List    START taking these medications   Details  sertraline (ZOLOFT) 50 MG tablet Take 1 tablet (50 mg total) by mouth daily. Qty: 30 tablet, Refills: 1         Note:  This document was prepared using Dragon voice recognition software and may include unintentional dictation errors.    Dionne BucySiadecki, Ashton Sabine, MD 03/07/18 1925

## 2018-03-07 NOTE — Discharge Instructions (Addendum)
We have provided referrals to [facility] ([facility], or the health department) as well as to a mental health provider.  Take the medication as prescribed by the psychiatrist.  Return to the ER for new, worsening, or persistent depression, anxiety, any thoughts of wanting to hurt yourself or anyone else, or any other new or worsening symptoms that concern you.

## 2018-03-12 ENCOUNTER — Observation Stay (HOSPITAL_BASED_OUTPATIENT_CLINIC_OR_DEPARTMENT_OTHER)
Admission: EM | Admit: 2018-03-12 | Discharge: 2018-03-12 | Disposition: A | Discharge status: Home / Self Care | Payer: Medicaid Other | Source: Home / Self Care | Admitting: Obstetrics and Gynecology

## 2018-03-12 ENCOUNTER — Emergency Department: Payer: Medicaid Other

## 2018-03-12 ENCOUNTER — Encounter: Payer: Self-pay | Admitting: Emergency Medicine

## 2018-03-12 ENCOUNTER — Other Ambulatory Visit: Payer: Self-pay

## 2018-03-12 DIAGNOSIS — Z3A36 36 weeks gestation of pregnancy: Secondary | ICD-10-CM | POA: Diagnosis not present

## 2018-03-12 DIAGNOSIS — O34219 Maternal care for unspecified type scar from previous cesarean delivery: Secondary | ICD-10-CM | POA: Diagnosis present

## 2018-03-12 DIAGNOSIS — O0933 Supervision of pregnancy with insufficient antenatal care, third trimester: Secondary | ICD-10-CM

## 2018-03-12 DIAGNOSIS — Z3490 Encounter for supervision of normal pregnancy, unspecified, unspecified trimester: Secondary | ICD-10-CM

## 2018-03-12 DIAGNOSIS — O99343 Other mental disorders complicating pregnancy, third trimester: Secondary | ICD-10-CM

## 2018-03-12 DIAGNOSIS — O09291 Supervision of pregnancy with other poor reproductive or obstetric history, first trimester: Secondary | ICD-10-CM

## 2018-03-12 DIAGNOSIS — O0930 Supervision of pregnancy with insufficient antenatal care, unspecified trimester: Secondary | ICD-10-CM

## 2018-03-12 DIAGNOSIS — O479 False labor, unspecified: Secondary | ICD-10-CM

## 2018-03-12 DIAGNOSIS — Z3493 Encounter for supervision of normal pregnancy, unspecified, third trimester: Secondary | ICD-10-CM

## 2018-03-12 DIAGNOSIS — O4700 False labor before 37 completed weeks of gestation, unspecified trimester: Secondary | ICD-10-CM

## 2018-03-12 DIAGNOSIS — F339 Major depressive disorder, recurrent, unspecified: Secondary | ICD-10-CM

## 2018-03-12 LAB — RAPID HIV SCREEN (HIV 1/2 AB+AG)
HIV 1/2 ANTIBODIES: NONREACTIVE
HIV-1 P24 Antigen - HIV24: NONREACTIVE

## 2018-03-12 LAB — URINALYSIS, ROUTINE W REFLEX MICROSCOPIC
BILIRUBIN URINE: NEGATIVE
GLUCOSE, UA: NEGATIVE mg/dL
Ketones, ur: NEGATIVE mg/dL
Nitrite: NEGATIVE
PH: 6 (ref 5.0–8.0)
Protein, ur: NEGATIVE mg/dL
SPECIFIC GRAVITY, URINE: 1.008 (ref 1.005–1.030)

## 2018-03-12 LAB — URINE DRUG SCREEN, QUALITATIVE (ARMC ONLY)
AMPHETAMINES, UR SCREEN: NOT DETECTED
Barbiturates, Ur Screen: NOT DETECTED
Cannabinoid 50 Ng, Ur ~~LOC~~: NOT DETECTED
Cocaine Metabolite,Ur ~~LOC~~: NOT DETECTED
MDMA (ECSTASY) UR SCREEN: NOT DETECTED
Methadone Scn, Ur: NOT DETECTED
OPIATE, UR SCREEN: NOT DETECTED
Phencyclidine (PCP) Ur S: NOT DETECTED
Tricyclic, Ur Screen: NOT DETECTED

## 2018-03-12 LAB — CHLAMYDIA/NGC RT PCR (ARMC ONLY)
CHLAMYDIA TR: NOT DETECTED
N gonorrhoeae: NOT DETECTED

## 2018-03-12 LAB — CBC
HEMATOCRIT: 32.9 % — AB (ref 35.0–47.0)
HEMOGLOBIN: 11.4 g/dL — AB (ref 12.0–16.0)
MCH: 28 pg (ref 26.0–34.0)
MCHC: 34.6 g/dL (ref 32.0–36.0)
MCV: 81 fL (ref 80.0–100.0)
Platelets: 236 10*3/uL (ref 150–440)
RBC: 4.06 MIL/uL (ref 3.80–5.20)
RDW: 15.2 % — ABNORMAL HIGH (ref 11.5–14.5)
WBC: 7.1 10*3/uL (ref 3.6–11.0)

## 2018-03-12 LAB — DIFFERENTIAL
Basophils Absolute: 0 10*3/uL (ref 0–0.1)
Basophils Relative: 0 %
Eosinophils Absolute: 0 10*3/uL (ref 0–0.7)
Eosinophils Relative: 1 %
LYMPHS ABS: 1 10*3/uL (ref 1.0–3.6)
LYMPHS PCT: 14 %
MONOS PCT: 7 %
Monocytes Absolute: 0.5 10*3/uL (ref 0.2–0.9)
NEUTROS PCT: 78 %
Neutro Abs: 5.6 10*3/uL (ref 1.4–6.5)

## 2018-03-12 MED ORDER — POTASSIUM CHLORIDE CRYS ER 10 MEQ PO TBCR
40.0000 meq | EXTENDED_RELEASE_TABLET | Freq: Once | ORAL | Status: AC
  Administered 2018-03-12: 40 meq via ORAL
  Filled 2018-03-12: qty 4

## 2018-03-12 MED ORDER — CITRANATAL 90 DHA 90-1 & 300 MG PO MISC: 1.0000 | Freq: Every day | ORAL | 6 refills | Status: DC

## 2018-03-12 MED ORDER — NITROFURANTOIN MONOHYD MACRO 100 MG PO CAPS: 100.0000 mg | ORAL_CAPSULE | Freq: Two times a day (BID) | ORAL | 0 refills | Status: AC

## 2018-03-12 NOTE — Final Progress Note (Signed)
L&D OB Triage Note  HPI:  Diana Richard is an unassigned 27 y.o. [phone] female at 4760w3d (by today's ER limited ultrasound), LMP unknown, with Estimated Date of Delivery: 04/06/18 who presents for complaints of lower pelvic pain(possible contractions) and hip pain x 1 day. She has not had any prenatal care with this pregnancy (patient notes she must have been in denial regarding the pregnancy as she stopped having periods around January or February, but just found out about the pregnancy in the Emergency Room last week).   Of note, patient does have a h/o substance abuse.  Stopped taking her Subutex in February.  Was seen in the ER last week due to complaints of stress, depression, and anxiety, and suicidal ideation (had occasional thoughts of self-harm but no plan).   OB History  Gravida Para Term Preterm AB Living  4 3 3     3   SAB TAB Ectopic Multiple Live Births        0 2    # Outcome Date GA Lbr Len/2nd Weight Sex Delivery Anes PTL Lv  4 Current           3 Term 10/04/16 158w1d  2470 g M CS-LTranv Spinal  LIV     Complications: IUGR (intrauterine growth restriction) affecting care of mother  2 Term 06/30/15 6345w0d / 00:31 3090 g M Vag-Vacuum EPI    1 Term 03/10/14 5025w5d   M Vag-Spont   LIV    Obstetric Comments  G2- Vacuum extraction for variable decelerations in second stage of labor.   G3- C-section due to transverse lie, oligohydramnios.   Patient on Subutex therapy during G2 and G3    Patient Active Problem List   Diagnosis Date Noted  . Severe recurrent major depression without psychotic features (HCC) 03/07/2018  . Pregnancy 03/07/2018  . Labor and delivery, indication for care 06/30/2015    Past Medical History:  Diagnosis Date  . Anxiety   . Depression     Past Surgical History:  Procedure Laterality Date  . CESAREAN SECTION N/A 10/04/2016   Procedure: CESAREAN SECTION;  Surgeon: Suzy Bouchardhomas J Schermerhorn, MD;  Location: [facility] ORS;  Service: Obstetrics;   Laterality: N/A;  . LUMBAR LAMINECTOMY/DECOMPRESSION MICRODISCECTOMY Left 10/11/2012   Procedure: LUMBAR LAMINECTOMY/DECOMPRESSION MICRODISCECTOMY 1 LEVEL;  Surgeon: Tia Alertavid S Jones, MD;  Location: [facility];  Service: Neurosurgery;  Laterality: Left;  left lumbar four-five  . LUMBAR LAMINECTOMY/DECOMPRESSION MICRODISCECTOMY Right 06/07/2013   Procedure: Right Lumbar five-sacral one microdiskectomy ;  Surgeon: Tia Alertavid S Jones, MD;  Location: [facility];  Service: Neurosurgery;  Laterality: Right;  Right Lumbar five-sacral one microdiskectomy   . TONSILLECTOMY      No current facility-administered medications on file prior to encounter.    Current Outpatient Medications on File Prior to Encounter  Medication Sig Dispense Refill  . sertraline (ZOLOFT) 50 MG tablet Take 1 tablet (50 mg total) by mouth daily. 30 tablet 1  . acetaminophen-codeine (TYLENOL #3) 300-30 MG tablet Take 1-2 tablets by mouth every 6 (six) hours as needed for moderate pain. (Patient not taking: Reported on 03/12/2018) 30 tablet 0  . buprenorphine (SUBUTEX) 8 MG SUBL SL tablet Place 8 mg under the tongue daily.    Marland Kitchen. gabapentin (NEURONTIN) 800 MG tablet Take 1 tablet (800 mg total) by mouth at bedtime. 3 tablet 0  . ibuprofen (ADVIL,MOTRIN) 600 MG tablet Take 1 tablet (600 mg total) by mouth every 6 (six) hours as needed. (Patient not taking: Reported on  03/12/2018) 60 tablet 0  . norethindrone (ORTHO MICRONOR) 0.35 MG tablet Take 1 tablet (0.35 mg total) by mouth daily. (Patient not taking: Reported on 10/04/2016) 1 Package 11  . Prenatal Vit-Fe Fumarate-FA (PRENATAL MULTIVITAMIN) TABS tablet Take 1 tablet by mouth daily at 12 noon.      No Known Allergies   ROS:  Review of Systems - Negative except what's noted in HPI   Physical Exam:  Blood pressure (!) 155/98, pulse 98, temperature 98.4 F (36.9 C), temperature source Oral, resp. rate 16, height 5\' 7"  (1.702 m), weight 70.3 kg, unknown if currently breastfeeding.   Repeat BP 133/87  General appearance: alert and no distress Abdomen: soft, non-tender; bowel sounds normal; no masses,  no organomegaly Pelvic: cervical exam performed by nurse, 1/70/ballotable Extremities: extremities normal, atraumatic, no cyanosis or edema  Skin: several scabs noted over lower extremities.    NST INTERPRETATION: Indications: rule out uterine contractions  Mode: External Baseline Rate (A): 155 bpm Variability: Moderate Accelerations: 15 x 15 Decelerations: None     Contraction Frequency (min): UI  Impression: reactive   Labs:  Urinalysis Results for orders placed or performed during the hospital encounter of 03/12/18  Differential  Result Value Ref Range   Neutrophils Relative % 78 %   Neutro Abs 5.6 1.4 - 6.5 K/uL   Lymphocytes Relative 14 %   Lymphs Abs 1.0 1.0 - 3.6 K/uL   Monocytes Relative 7 %   Monocytes Absolute 0.5 0.2 - 0.9 K/uL   Eosinophils Relative 1 %   Eosinophils Absolute 0.0 0 - 0.7 K/uL   Basophils Relative 0 %   Basophils Absolute 0.0 0 - 0.1 K/uL  Urinalysis, Routine w reflex microscopic  Result Value Ref Range   Color, Urine YELLOW (A) YELLOW   APPearance HAZY (A) CLEAR   Specific Gravity, Urine 1.008 1.005 - 1.030   pH 6.0 5.0 - 8.0   Glucose, UA NEGATIVE NEGATIVE mg/dL   Hgb urine dipstick SMALL (A) NEGATIVE   Bilirubin Urine NEGATIVE NEGATIVE   Ketones, ur NEGATIVE NEGATIVE mg/dL   Protein, ur NEGATIVE NEGATIVE mg/dL   Nitrite NEGATIVE NEGATIVE   Leukocytes, UA SMALL (A) NEGATIVE   RBC / HPF 0-5 0 - 5 RBC/hpf   WBC, UA 11-20 0 - 5 WBC/hpf   Bacteria, UA MANY (A) NONE SEEN   Squamous Epithelial / LPF 6-10 0 - 5   Mucus PRESENT   Urine Drug Screen, Qualitative ([facility] only)  Result Value Ref Range   Tricyclic, Ur Screen NONE DETECTED NONE DETECTED   Amphetamines, Ur Screen NONE DETECTED NONE DETECTED   MDMA (Ecstasy)Ur Screen NONE DETECTED NONE DETECTED   Cocaine Metabolite,Ur [place] NONE DETECTED NONE DETECTED   Opiate,  Ur Screen NONE DETECTED NONE DETECTED   Phencyclidine (PCP) Ur S NONE DETECTED NONE DETECTED   Cannabinoid 50 Ng, Ur [place] NONE DETECTED NONE DETECTED   Barbiturates, Ur Screen NONE DETECTED NONE DETECTED   Benzodiazepine, Ur Scrn TEST NOT PERFORMED, REAGENT NOT AVAILABLE (A) NONE DETECTED   Methadone Scn, Ur NONE DETECTED NONE DETECTED  CBC  Result Value Ref Range   WBC 7.1 3.6 - 11.0 K/uL   RBC 4.06 3.80 - 5.20 MIL/uL   Hemoglobin 11.4 (L) 12.0 - 16.0 g/dL   HCT 16.1 (L) 09.6 - 04.5 %   MCV 81.0 80.0 - 100.0 fL   MCH 28.0 26.0 - 34.0 pg   MCHC 34.6 32.0 - 36.0 g/dL   RDW 40.9 (H) 81.1 - 91.4 %  Platelets 236 150 - 440 K/uL     Imaging:  US OB Limited CLINICAL DATA:  Contracting.  No prenatal care.  Uncertain LMP.  EXAM: LIMITED OBSTETRIC ULTRASOUND  COMPARISON:  None applicable  FINDINGS: Number of Fetuses: 1  Heart Rate:  160 bpm  Movement: Present  Presentation: Cephalic  Placental Location: Anterior  Previa: None  Amniotic Fluid (Subjective):  Normal  AFI: 20.6 cm  BPD: 9.0 cm 36 w  3 d  MATERNAL FINDINGS:  Cervix:  Not well seen  Uterus/Adnexae: No abnormality visualized.  IMPRESSION: 1. Single living intrauterine fetus in cephalic presentation. 2. Limited biometry correlates with the age of 36 weeks 3 days. 3. Amniotic fluid volume is normal.  This exam is performed on an emergent basis and does not comprehensively evaluate fetal size, dating, or anatomy; follow-up complete OB US should be considered if further fetal assessment is warranted.  Electronically Signed   By: Norva Pavlov M.D.   On: 03/12/2018 15:06   Assessment:  27 y.o. [id number] at [redacted]w[redacted]d with:  1.  No prenatal care 2.  Previous C-section x 1 3. Prior substance abuse 4. Depression in pregnancy 5.  UTI in pregnancy  Plan:  1. OB panel performed today for no PNC to date. Patient reports denial during current pregnancy.  Can establish with [facility], or can return to  [facility] where patient had last  pregnancy. Patient notes she will establish with [facility].  2. Previous C-section x 1, patient unsure of desires to [facility] vs repeat C-section. Given handouts on both. Will discuss further at prenatal visit.  3.  Prior h/o substance abuse. UDS positive for amphetimines during ER visit last, week, negative today.  Was on Subutex for last 2 pregnancies. Discontinued in February of this year.  4. Depression in pregnancy, initiated on Zoloft last week.  Patient notes compliance with medications. Denies SI/HI today.  5. UTI in pregnancy, may be cause of uterine irritability and abdominal pain.  Will send for culture, and treat empirically with Macrobid.    Hildred Laser, MD [facility]

## 2018-03-12 NOTE — ED Provider Notes (Signed)
[facility] Emergency Department Provider Note  ____________________________________________  Time seen: Approximately 3:13 PM  I have reviewed the triage vital signs and the nursing notes.   HISTORY  Chief Complaint Contractions    HPI Diana Richard is a 27 y.o. female with a history of depression and currently pregnant without any prenatal care who comes the ED complaining of contraction pain that started at 3 AM today.  Occurring regularly about every 10 to 15 minutes.  Vaginal bleeding or discharge or leakage of fluid.  No chest pain or shortness of breath.  No aggravating or alleviating factors.  Pain radiates to the low back.      Past Medical History:  Diagnosis Date  . Anxiety   . Depression      Patient Active Problem List   Diagnosis Date Noted  . Severe recurrent major depression without psychotic features (HCC) 03/07/2018  . Pregnancy 03/07/2018  . Postoperative state 10/05/2016  . Pregnancy affected by fetal growth restriction 10/04/2016  . Labor and delivery, indication for care 06/30/2015  . Indication for care or intervention related to labor and delivery 06/30/2015  . Variable fetal heart rate decelerations, antepartum 06/03/2015  . First trimester screening 12/22/2014     Past Surgical History:  Procedure Laterality Date  . CESAREAN SECTION N/A 10/04/2016   Procedure: CESAREAN SECTION;  Surgeon: Suzy Bouchardhomas J Schermerhorn, MD;  Location: [facility] ORS;  Service: Obstetrics;  Laterality: N/A;  . LUMBAR LAMINECTOMY/DECOMPRESSION MICRODISCECTOMY Left 10/11/2012   Procedure: LUMBAR LAMINECTOMY/DECOMPRESSION MICRODISCECTOMY 1 LEVEL;  Surgeon: Tia Alertavid S Jones, MD;  Location: [facility];  Service: Neurosurgery;  Laterality: Left;  left lumbar four-five  . LUMBAR LAMINECTOMY/DECOMPRESSION MICRODISCECTOMY Right 06/07/2013   Procedure: Right Lumbar five-sacral one microdiskectomy ;  Surgeon: Tia Alertavid S Jones, MD;  Location: [facility];  Service:  Neurosurgery;  Laterality: Right;  Right Lumbar five-sacral one microdiskectomy   . TONSILLECTOMY       Prior to Admission medications   Medication Sig Start Date End Date Taking? Authorizing Provider  sertraline (ZOLOFT) 50 MG tablet Take 1 tablet (50 mg total) by mouth daily. 03/07/18 05/06/18 Yes Clapacs, Jackquline DenmarkJohn T, MD  acetaminophen-codeine (TYLENOL #3) 300-30 MG tablet Take 1-2 tablets by mouth every 6 (six) hours as needed for moderate pain. Patient not taking: Reported on 03/12/2018 10/07/16   [name], Bethany, MD  buprenorphine (SUBUTEX) 8 MG SUBL SL tablet Place 8 mg under the tongue daily.    [provider]  gabapentin (NEURONTIN) 800 MG tablet Take 1 tablet (800 mg total) by mouth at bedtime. 10/07/16 10/10/16  [name], Bethany, MD  ibuprofen (ADVIL,MOTRIN) 600 MG tablet Take 1 tablet (600 mg total) by mouth every 6 (six) hours as needed. Patient not taking: Reported on 03/12/2018 07/02/15   Schermerhorn, Ihor Austinhomas J, MD  norethindrone (ORTHO MICRONOR) 0.35 MG tablet Take 1 tablet (0.35 mg total) by mouth daily. Patient not taking: Reported on 10/04/2016 07/02/15   Schermerhorn, Ihor Austinhomas J, MD  Prenatal Vit-Fe Fumarate-FA (PRENATAL MULTIVITAMIN) TABS tablet Take 1 tablet by mouth daily at 12 noon.    [provider]     Allergies Patient has no known allergies.   No family history on file.  Social History Social History   Tobacco Use  . Smoking status: Current Every Day Smoker    Packs/day: 0.25    Years: 6.00    Pack years: 1.50    Types: Cigarettes  . Smokeless tobacco: Never Used  Substance Use Topics  . Alcohol  use: No  . 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  ENT:   No sore throat. No rhinorrhea. Cardiovascular:   No chest pain or syncope. Respiratory:   No dyspnea or cough. Gastrointestinal:   As it of as above for cramping abdominal pain.  Musculoskeletal:    Negative for focal pain or swelling All other systems reviewed and are negative except as documented above in ROS and HPI.  ____________________________________________   PHYSICAL EXAM:  VITAL SIGNS: ED Triage Vitals  Enc Vitals Group     BP 03/12/18 1405 139/90     Pulse Rate 03/12/18 1405 (!) 110     Resp 03/12/18 1405 20     Temp 03/12/18 1405 98.2 F (36.8 C)     Temp Source 03/12/18 1405 Oral     SpO2 --      Weight 03/12/18 1406 160 lb (72.6 kg)     Height 03/12/18 1406 5\' 7"  (1.702 m)     Head Circumference --      Peak Flow --      Pain Score 03/12/18 1406 7     Pain Loc --      Pain Edu? --      Excl. in GC? --     Vital signs reviewed, nursing assessments reviewed.   Constitutional:   Alert and oriented. Non-toxic appearance. Eyes:   Conjunctivae are normal. EOMI.  ENT      Head:   Normocephalic and atraumatic.      Nose:   No congestion/rhinnorhea.       Mouth/Throat:   MMM, no pharyngeal erythema. No peritonsillar mass.       Neck:   No meningismus. Full ROM. Hematological/Lymphatic/Immunilogical:   No cervical lymphadenopathy. Cardiovascular:   RRR. Symmetric bilateral radial and DP pulses.  No murmurs. Cap refill less than 2 seconds. Respiratory:   Normal respiratory effort without tachypnea/retractions. Breath sounds are clear and equal bilaterally. No wheezes/rales/rhonchi. Gastrointestinal:   Soft and nontender.  Gravid.  Non distended. There is no CVA tenderness.  No rebound, rigidity, or guarding. Musculoskeletal:   Normal range of motion in all extremities. No joint effusions.  No lower extremity tenderness.  No edema. Neurologic:   Normal speech and language.  Motor grossly intact. No acute focal neurologic deficits are appreciated.  .  ____________________________________________    LABS (pertinent positives/negatives) (all labs ordered are listed, but only abnormal results are displayed) Labs Reviewed - No data to  display ____________________________________________   EKG    ____________________________________________    RADIOLOGY  US Ob Limited  Result Date: 03/12/2018 CLINICAL DATA:  Contracting.  No prenatal care.  Uncertain LMP. EXAM: LIMITED OBSTETRIC ULTRASOUND COMPARISON:  None applicable FINDINGS: Number of Fetuses: 1 Heart Rate:  160 bpm Movement: Present Presentation: Cephalic Placental Location: Anterior Previa: None Amniotic Fluid (Subjective):  Normal AFI: 20.6 cm BPD: 9.0 cm 36 w  3 d MATERNAL FINDINGS: Cervix:  Not well seen Uterus/Adnexae: No abnormality visualized. IMPRESSION: 1. Single living intrauterine fetus in cephalic presentation. 2. Limited biometry correlates with the age of 36 weeks 3 days. 3. Amniotic fluid volume is normal. This exam is performed on an emergent basis and does not comprehensively evaluate fetal size, dating, or anatomy; follow-up complete OB US should be considered if further fetal assessment is warranted. Electronically Signed   By: Norva Pavlov M.D.   On: 03/12/2018 15:06  ____________________________________________   PROCEDURES Procedures  ____________________________________________    CLINICAL IMPRESSION / ASSESSMENT AND PLAN / ED COURSE  Pertinent labs & imaging results that were available during my care of the patient were reviewed by me and considered in my medical decision making (see chart for details).    Patient presents with pregnancy and contractions.  Ultrasound was obtained for dating purposes given lack of prenatal care, shows size consistent with gestational age of [redacted] weeks 3 days.  Normal amniotic fluid volume.  Given the possibility of patient being in early labor, patient will be transferred to labor and delivery for further assessment.  She is medically stable from an ED perspective without other acute medical issues      ____________________________________________   FINAL CLINICAL IMPRESSION(S) / ED  DIAGNOSES    Final diagnoses:  Preterm contractions  Third trimester pregnancy     ED Discharge Orders    None      Portions of this note were generated with dragon dictation software. Dictation errors may occur despite best attempts at proofreading.    Sharman Cheek, MD 03/12/18 1517

## 2018-03-12 NOTE — ED Triage Notes (Signed)
Arrives stating contractions since 3am. States has denies pregnancy until told during behavioral health visit this month she was pregnant. L and D contacted but will not see patient until some confirmation of patient being past [redacted] weeks gestation.

## 2018-03-12 NOTE — OB Triage Note (Addendum)
Pt is a G4P3 with c/o ctx beginning at 04:30AM. No Prenatal Care, pt just found out she was pregnant last week. Previous C/S d/t oligohydramnios. Pt states positive fetal movement. Pt denies LOF or vaginal bleeding. Pt denies blurry vision, RUQ pain, and dizziness.   Monitors applied and assessing. Initial FHT 160.

## 2018-03-13 ENCOUNTER — Encounter: Payer: Self-pay | Admitting: *Deleted

## 2018-03-13 ENCOUNTER — Inpatient Hospital Stay
Admission: EM | Admit: 2018-03-13 | Discharge: 2018-03-14 | DRG: 776 | Disposition: A | Discharge status: Home / Self Care | Payer: Medicaid Other | Attending: Obstetrics and Gynecology | Admitting: Obstetrics and Gynecology

## 2018-03-13 DIAGNOSIS — O0930 Supervision of pregnancy with insufficient antenatal care, unspecified trimester: Secondary | ICD-10-CM

## 2018-03-13 DIAGNOSIS — O99345 Other mental disorders complicating the puerperium: Secondary | ICD-10-CM | POA: Diagnosis present

## 2018-03-13 DIAGNOSIS — O0933 Supervision of pregnancy with insufficient antenatal care, third trimester: Secondary | ICD-10-CM

## 2018-03-13 DIAGNOSIS — O99335 Smoking (tobacco) complicating the puerperium: Secondary | ICD-10-CM | POA: Diagnosis present

## 2018-03-13 DIAGNOSIS — F1911 Other psychoactive substance abuse, in remission: Secondary | ICD-10-CM

## 2018-03-13 DIAGNOSIS — O9081 Anemia of the puerperium: Secondary | ICD-10-CM | POA: Diagnosis not present

## 2018-03-13 DIAGNOSIS — O09291 Supervision of pregnancy with other poor reproductive or obstetric history, first trimester: Secondary | ICD-10-CM

## 2018-03-13 DIAGNOSIS — F53 Postpartum depression: Secondary | ICD-10-CM | POA: Diagnosis present

## 2018-03-13 DIAGNOSIS — Z87898 Personal history of other specified conditions: Secondary | ICD-10-CM | POA: Diagnosis not present

## 2018-03-13 DIAGNOSIS — O8620 Urinary tract infection following delivery, unspecified: Secondary | ICD-10-CM | POA: Diagnosis present

## 2018-03-13 DIAGNOSIS — O34219 Maternal care for unspecified type scar from previous cesarean delivery: Secondary | ICD-10-CM

## 2018-03-13 DIAGNOSIS — F17210 Nicotine dependence, cigarettes, uncomplicated: Secondary | ICD-10-CM | POA: Diagnosis present

## 2018-03-13 DIAGNOSIS — O2343 Unspecified infection of urinary tract in pregnancy, third trimester: Secondary | ICD-10-CM

## 2018-03-13 DIAGNOSIS — Z3490 Encounter for supervision of normal pregnancy, unspecified, unspecified trimester: Secondary | ICD-10-CM

## 2018-03-13 DIAGNOSIS — F332 Major depressive disorder, recurrent severe without psychotic features: Secondary | ICD-10-CM | POA: Diagnosis present

## 2018-03-13 LAB — RPR: RPR Ser Ql: NONREACTIVE

## 2018-03-13 LAB — HEPATITIS B SURFACE ANTIGEN: Hepatitis B Surface Ag: NEGATIVE

## 2018-03-13 LAB — VARICELLA ZOSTER ANTIBODY, IGG: VARICELLA IGG: 3168 {index} (ref >165)

## 2018-03-13 LAB — RUBELLA SCREEN: RUBELLA: 2.46 {index} (ref >0.99)

## 2018-03-13 MED ORDER — NITROFURANTOIN MONOHYD MACRO 100 MG PO CAPS
100.0000 mg | ORAL_CAPSULE | Freq: Two times a day (BID) | ORAL | Status: DC
  Administered 2018-03-13 – 2018-03-14 (×3): 100 mg via ORAL
  Filled 2018-03-13 (×5): qty 1

## 2018-03-13 MED ORDER — AMMONIA AROMATIC IN INHA
RESPIRATORY_TRACT | Status: AC
  Filled 2018-03-13: qty 10

## 2018-03-13 MED ORDER — IBUPROFEN 800 MG PO TABS
800.0000 mg | ORAL_TABLET | Freq: Three times a day (TID) | ORAL | Status: DC
  Administered 2018-03-13 – 2018-03-14 (×3): 800 mg via ORAL
  Filled 2018-03-13 (×3): qty 1

## 2018-03-13 MED ORDER — BENZOCAINE-MENTHOL 20-0.5 % EX AERO
1.0000 "application " | INHALATION_SPRAY | CUTANEOUS | Status: DC | PRN
  Administered 2018-03-13: 1 via TOPICAL
  Filled 2018-03-13: qty 56

## 2018-03-13 MED ORDER — SIMETHICONE 80 MG PO CHEW: 80.0000 mg | CHEWABLE_TABLET | ORAL | Status: DC | PRN

## 2018-03-13 MED ORDER — PRENATAL MULTIVITAMIN CH
1.0000 | ORAL_TABLET | Freq: Every day | ORAL | Status: DC
  Administered 2018-03-13 – 2018-03-14 (×2): 1 via ORAL
  Filled 2018-03-13 (×2): qty 1

## 2018-03-13 MED ORDER — ONDANSETRON HCL 4 MG/2ML IJ SOLN: 4.0000 mg | Freq: Four times a day (QID) | INTRAMUSCULAR | Status: DC | PRN

## 2018-03-13 MED ORDER — IBUPROFEN 800 MG PO TABS
800.0000 mg | ORAL_TABLET | Freq: Four times a day (QID) | ORAL | Status: DC | PRN
  Administered 2018-03-13: 800 mg via ORAL

## 2018-03-13 MED ORDER — LIDOCAINE HCL (PF) 1 % IJ SOLN
INTRAMUSCULAR | Status: AC
  Filled 2018-03-13: qty 30

## 2018-03-13 MED ORDER — ACETAMINOPHEN 325 MG PO TABS
650.0000 mg | ORAL_TABLET | ORAL | Status: DC | PRN
  Administered 2018-03-13 (×3): 650 mg via ORAL
  Filled 2018-03-13 (×3): qty 2

## 2018-03-13 MED ORDER — DIBUCAINE 1 % RE OINT
1.0000 "application " | TOPICAL_OINTMENT | RECTAL | Status: DC | PRN
  Administered 2018-03-13: 1 via RECTAL
  Filled 2018-03-13: qty 28

## 2018-03-13 MED ORDER — IBUPROFEN 800 MG PO TABS
ORAL_TABLET | ORAL | Status: AC
  Administered 2018-03-13: 800 mg via ORAL
  Filled 2018-03-13: qty 1

## 2018-03-13 MED ORDER — SENNOSIDES-DOCUSATE SODIUM 8.6-50 MG PO TABS
2.0000 | ORAL_TABLET | ORAL | Status: DC
  Filled 2018-03-13: qty 2

## 2018-03-13 MED ORDER — WITCH HAZEL-GLYCERIN EX PADS
1.0000 "application " | MEDICATED_PAD | CUTANEOUS | Status: DC | PRN
  Administered 2018-03-13: 1 via TOPICAL
  Filled 2018-03-13: qty 100

## 2018-03-13 MED ORDER — ONDANSETRON HCL 4 MG PO TABS
4.0000 mg | ORAL_TABLET | ORAL | Status: DC | PRN
  Administered 2018-03-13: 4 mg via ORAL
  Filled 2018-03-13: qty 1

## 2018-03-13 MED ORDER — ACETAMINOPHEN 325 MG PO TABS
650.0000 mg | ORAL_TABLET | ORAL | Status: DC | PRN
  Administered 2018-03-13: 650 mg via ORAL

## 2018-03-13 MED ORDER — ZOLPIDEM TARTRATE 5 MG PO TABS: 5.0000 mg | ORAL_TABLET | Freq: Every evening | ORAL | Status: DC | PRN

## 2018-03-13 MED ORDER — COCONUT OIL OIL: 1.0000 "application " | TOPICAL_OIL | Status: DC | PRN

## 2018-03-13 MED ORDER — OXYTOCIN 10 UNIT/ML IJ SOLN
INTRAMUSCULAR | Status: AC
  Filled 2018-03-13: qty 2

## 2018-03-13 MED ORDER — SERTRALINE HCL 25 MG PO TABS
50.0000 mg | ORAL_TABLET | Freq: Every day | ORAL | Status: DC
  Administered 2018-03-13 – 2018-03-14 (×2): 50 mg via ORAL
  Filled 2018-03-13 (×2): qty 2

## 2018-03-13 MED ORDER — MISOPROSTOL 200 MCG PO TABS
ORAL_TABLET | ORAL | Status: AC
  Filled 2018-03-13: qty 4

## 2018-03-13 MED ORDER — ACETAMINOPHEN 325 MG PO TABS
ORAL_TABLET | ORAL | Status: AC
  Administered 2018-03-13: 650 mg via ORAL
  Filled 2018-03-13: qty 1

## 2018-03-13 MED ORDER — SOD CITRATE-CITRIC ACID 500-334 MG/5ML PO SOLN: 30.0000 mL | ORAL | Status: DC | PRN

## 2018-03-13 MED ORDER — ONDANSETRON HCL 4 MG/2ML IJ SOLN: 4.0000 mg | INTRAMUSCULAR | Status: DC | PRN

## 2018-03-13 MED ORDER — TETANUS-DIPHTH-ACELL PERTUSSIS 5-2.5-18.5 LF-MCG/0.5 IM SUSP
0.5000 mL | Freq: Once | INTRAMUSCULAR | Status: AC
  Administered 2018-03-14: 0.5 mL via INTRAMUSCULAR
  Filled 2018-03-13: qty 0.5

## 2018-03-13 MED ORDER — DIPHENHYDRAMINE HCL 25 MG PO CAPS: 25.0000 mg | ORAL_CAPSULE | Freq: Four times a day (QID) | ORAL | Status: DC | PRN

## 2018-03-13 NOTE — Progress Notes (Signed)
Dr. Valentino Saxonherry notified patient is still not back to mother/baby unit, patient has been overhead paged, security notified to be looking for patient, nursing supervisor and unit director aware of situation. All of patient's belongings are in her room. Tried calling patient's phone [phone]) but went straight to voicemail.

## 2018-03-13 NOTE — Progress Notes (Signed)
Patient is very lethargic upon initial assessment this morning. VSS, able to follow commands but unable to keep eyes open. Pt states "I just want to sleep". No visitors at bedside. Baby in SCN. Call bell within reach, bed alarm on. Will continue to closely monitor patient.

## 2018-03-13 NOTE — Progress Notes (Signed)
Patient wheeled into SCN to visit with baby.

## 2018-03-13 NOTE — Progress Notes (Addendum)
Patient has been alert & oriented x4, appropriate for situation since waking up around 1100am, denies feeling sad or suicidal stating "the new medicine is helping a lot". Grandmother at bedside helping patient come up with a name for her baby.

## 2018-03-13 NOTE — OB Triage Note (Signed)
Recvd from ED. PT came in via EMS. States she felt baby moving around 4-5 months but just took a pregnancy test 4 weeks ago. Has had no prenatal care and was seen yesterday for contractions but was sent home. Her first two deliveries were delivered vaginally and the last baby was a c/s. States she was in denial about being pregnant. Baby in room with pt and placenta was delivered in the ambulance. Pt fundus firm and U/E, small with trickle.

## 2018-03-13 NOTE — Progress Notes (Signed)
Mother/baby RN went to speak with the SCN RN that is caring for the baby; the patient's blood type is A Negative; currently there is no blood type on the baby; mother/baby RN requested that the SCN RN that's caring for the baby call the NNP and request a blood type be drawn on the baby; this will be the only way to determine if the patient needs Rhogam (patient would need Rhogam prior to being [facility])

## 2018-03-13 NOTE — Progress Notes (Signed)
Patient returned to room with friend.

## 2018-03-13 NOTE — Progress Notes (Signed)
Patient wheeled back to SCN to visit baby.

## 2018-03-13 NOTE — Progress Notes (Signed)
Patient wheeled back into room 341 from SCN.

## 2018-03-13 NOTE — Progress Notes (Addendum)
Patient is no where to be found on mother/baby unit or visiting baby in SCN. 3rd floor security officer stated they saw her about 20 minutes leave the 3rd floor with a friend.

## 2018-03-13 NOTE — Progress Notes (Signed)
Requested Psych consult for patient due to history of depression, anxiety, and substance abuse. Dr. Valentino Saxonherry stated to wait and see how she is once she wakes up more this morning. Will continue to closely monitor patient.

## 2018-03-13 NOTE — H&P (Signed)
Obstetric History and Physical  Diana Richard is a 27 y.o. [phone] with IUP at [redacted]w[redacted]d (dated by yesterday's limited ER ultrasound) presenting s/p home delivery (VBAC).  Of note, patient has a h/o no prenatal care this pregnancy, recently diagnosed depression (started on Zoloft last week in the ER) and prior C-section with last delivery due to oligohydramnios and transverse lie in postdates pregnancy. She reports being in denial about the pregnancy, only officially being diagnosed in the ER last week. She also has a prior h/o drug abuse, however yesterday's UDS negative (but one performed 5 days ago in ER was positive for amphetamines).  Additionally, patient was seen yesterday in triage with complaints of abdominal pain in pregnancy, was diagnosed with a UTI and was ruled out for labor.  Patient states after being discharged yesterday she continued having the discomfort, and was having difficulty sleeping. States she woke up to go to the restroom around 0200 and noted that "she felt her water break, and then there was a head coming out". States that a family member called EMS. She denied vaginal bleeding, and endorsed active fetal movement.    Prenatal Course Source of Care: none Pregnancy complications or risks: Patient Active Problem List   Diagnosis Date Noted  . SVD (spontaneous vaginal delivery) 03/13/2018  . History of drug abuse 03/13/2018  . No prenatal care in current pregnancy 03/12/2018  . History of cesarean delivery affecting pregnancy 03/12/2018  . History of oligohydramnios in prior pregnancy, currently pregnant, first trimester 03/12/2018  . Severe recurrent major depression without psychotic features (HCC) 03/07/2018  . Pregnancy 03/07/2018  . Labor and delivery, indication for care 06/30/2015   She plans to bottle feed She desires oral contraceptives (estrogen/progesterone) for postpartum contraception.   Prenatal labs and studies: ABO, Rh:   Antibody:   Rubella:   RPR:     HBsAg:    HIV: NON REACTIVE (08/26 1608)  GBS:  1 hr Glucola  Not performed Genetic screening not done (no PNC) Anatomy US (not done, no [facility])   Past Medical History:  Diagnosis Date  . Anxiety   . Depression     Past Surgical History:  Procedure Laterality Date  . CESAREAN SECTION N/A 10/04/2016   Procedure: CESAREAN SECTION;  Surgeon: Suzy Bouchard, MD;  Location: [facility] ORS;  Service: Obstetrics;  Laterality: N/A;  . LUMBAR LAMINECTOMY/DECOMPRESSION MICRODISCECTOMY Left 10/11/2012   Procedure: LUMBAR LAMINECTOMY/DECOMPRESSION MICRODISCECTOMY 1 LEVEL;  Surgeon: Tia Alert, MD;  Location: [facility];  Service: Neurosurgery;  Laterality: Left;  left lumbar four-five  . LUMBAR LAMINECTOMY/DECOMPRESSION MICRODISCECTOMY Right 06/07/2013   Procedure: Right Lumbar five-sacral one microdiskectomy ;  Surgeon: Tia Alert, MD;  Location: [facility] OR[facility];  Service: Neurosurgery;  Laterality: Right;  Right Lumbar five-sacral one microdiskectomy   . TONSILLECTOMY      OB History  Gravida Para Term Preterm AB Living  4 4 3 1   4   SAB TAB Ectopic Multiple Live Births        0 3    # Outcome Date GA Lbr Len/2nd Weight Sex Delivery Anes PTL Lv  4 Preterm 03/13/18 [redacted]w[redacted]d   F Vag-Spont None  LIV  3 Term 10/04/16 [redacted]w[redacted]d  2470 g M CS-LTranv Spinal  LIV     Complications: IUGR (intrauterine growth restriction) affecting care of mother  2 Term 06/30/15 [redacted]w[redacted]d / 00:31 3090 g Judie Petit Vag-Vacuum EPI    1 Term 03/10/14 [redacted]w[redacted]d   M Vag-Spont   LIV  Obstetric Comments  G2- Vacuum extraction for variable decelerations in second stage of labor.   G3- C-section due to transverse lie, oligohydramnios.   Patient on Subutex therapy during G2 and G3    Social History   Socioeconomic History  . Marital status: Single    Spouse name: Not on file  . Number of children: Not on file  . Years of education: Not on file  . Highest education level: Not on file  Occupational History  . Not on file   Social Needs  . Financial resource strain: Not on file  . Food insecurity:    Worry: Not on file    Inability: Not on file  . Transportation needs:    Medical: Not on file    Non-medical: Not on file  Tobacco Use  . Smoking status: Current Every Day Smoker    Packs/day: 0.25    Years: 6.00    Pack years: 1.50    Types: Cigarettes  . Smokeless tobacco: Never Used  Substance and Sexual Activity  . Alcohol use: No  . 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 Sexual activity: Yes    Birth control/protection: Pill  Lifestyle  . Physical activity:    Days per week: Not on file    Minutes per session: Not on file  . Stress: Not on file  Relationships  . Social connections:    Talks on phone: Not on file    Gets together: Not on file    Attends religious service: Not on file    Active member of club or organization: Not on file    Attends meetings of clubs or organizations: Not on file    Relationship status: Not on file  Other Topics Concern  . Not on file  Social History Narrative  . Not on file    History reviewed. No pertinent family history.  Medications Prior to Admission  Medication Sig Dispense Refill Last Dose  . sertraline (ZOLOFT) 50 MG tablet Take 1 tablet (50 mg total) by mouth daily. 30 tablet 1 03/12/2018 at Unknown time  . nitrofurantoin, macrocrystal-monohydrate, (MACROBID) 100 MG capsule Take 1 capsule (100 mg total) by mouth 2 (two) times daily for 7 days. (Patient not taking: Reported on 03/13/2018) 14 capsule 0 Not Taking at Unknown time  . Prenat w/o A-FeCbGl-DSS-FA-DHA (CITRANATAL 90 DHA) 90-1 & 300 MG MISC Take 1 tablet by mouth daily. (Patient not taking: Reported on 03/13/2018) 30 each 6 Not Taking at Unknown time    No Known Allergies  Review of Systems: Negative except for what is mentioned in HPI.  Physical Exam: BP 131/88   Pulse (!) 105   Temp 97.7 F (36.5 C) (Oral)   Resp 16   Ht 5\' 7"  (1.702 m)   Wt 68 kg    LMP  (LMP Unknown)   Breastfeeding? Unknown   BMI 23.49 kg/m  CONSTITUTIONAL: Well-developed, well-nourished female in no acute distress.  HENT:  Normocephalic, atraumatic, External right and left ear normal. Oropharynx is clear and moist EYES: Conjunctivae and EOM are normal. Pupils are equal, round, and reactive to light. No scleral icterus.  NECK: Normal range of motion, supple, no masses SKIN: Skin is warm and dry. No rash noted. Not diaphoretic. No erythema. No pallor. NEUROLOGIC: Alert and oriented to person, place, and time. Normal reflexes, muscle tone coordination. No cranial nerve deficit noted. PSYCHIATRIC: Normal mood and affect. Normal behavior. Normal judgment and thought content. CARDIOVASCULAR:  Normal heart rate noted, regular rhythm RESPIRATORY: Effort and breath sounds normal, no problems with respiration noted ABDOMEN: Soft, nontender, nondistended, gravid. MUSCULOSKELETAL: Normal range of motion. No edema and no tenderness. 2+ distal pulses.  Vaginal exam: no lacerations, small amount of blood with membranes at perineum, removed.    Pertinent Labs/Studies:   Results for orders placed or performed during the hospital encounter of 03/12/18 (from the past 24 hour(s))  Urinalysis, Routine w reflex microscopic     Status: Abnormal   Collection Time: 03/12/18  3:21 PM  Result Value Ref Range   Color, Urine YELLOW (A) YELLOW   APPearance HAZY (A) CLEAR   Specific Gravity, Urine 1.008 1.005 - 1.030   pH 6.0 5.0 - 8.0   Glucose, UA NEGATIVE NEGATIVE mg/dL   Hgb urine dipstick SMALL (A) NEGATIVE   Bilirubin Urine NEGATIVE NEGATIVE   Ketones, ur NEGATIVE NEGATIVE mg/dL   Protein, ur NEGATIVE NEGATIVE mg/dL   Nitrite NEGATIVE NEGATIVE   Leukocytes, UA SMALL (A) NEGATIVE   RBC / HPF 0-5 0 - 5 RBC/hpf   WBC, UA 11-20 0 - 5 WBC/hpf   Bacteria, UA MANY (A) NONE SEEN   Squamous Epithelial / LPF 6-10 0 - 5   Mucus PRESENT   Urine Drug Screen, Qualitative ([facility] only)      Status: Abnormal   Collection Time: 03/12/18  3:21 PM  Result Value Ref Range   Tricyclic, Ur Screen NONE DETECTED NONE DETECTED   Amphetamines, Ur Screen NONE DETECTED NONE DETECTED   MDMA (Ecstasy)Ur Screen NONE DETECTED NONE DETECTED   Cocaine Metabolite,Ur [place] NONE DETECTED NONE DETECTED   Opiate, Ur Screen NONE DETECTED NONE DETECTED   Phencyclidine (PCP) Ur S NONE DETECTED NONE DETECTED   Cannabinoid 50 Ng, Ur  NONE DETECTED NONE DETECTED   Barbiturates, Ur Screen NONE DETECTED NONE DETECTED   Benzodiazepine, Ur Scrn TEST NOT PERFORMED, REAGENT NOT AVAILABLE (A) NONE DETECTED   Methadone Scn, Ur NONE DETECTED NONE DETECTED  Chlamydia/NGC rt PCR ([facility] only)     Status: None   Collection Time: 03/12/18  3:49 PM  Result Value Ref Range   Specimen source GC/Chlam ENDOCERVICAL    Chlamydia Tr NOT DETECTED NOT DETECTED   N gonorrhoeae NOT DETECTED NOT DETECTED  Differential     Status: None   Collection Time: 03/12/18  4:08 PM  Result Value Ref Range   Neutrophils Relative % 78 %   Neutro Abs 5.6 1.4 - 6.5 K/uL   Lymphocytes Relative 14 %   Lymphs Abs 1.0 1.0 - 3.6 K/uL   Monocytes Relative 7 %   Monocytes Absolute 0.5 0.2 - 0.9 K/uL   Eosinophils Relative 1 %   Eosinophils Absolute 0.0 0 - 0.7 K/uL   Basophils Relative 0 %   Basophils Absolute 0.0 0 - 0.1 K/uL  Rapid HIV screen (HIV 1/2 Ab+Ag)     Status: None   Collection Time: 03/12/18  4:08 PM  Result Value Ref Range   HIV-1 P24 Antigen - HIV24 NON REACTIVE NON REACTIVE   HIV 1/2 Antibodies NON REACTIVE NON REACTIVE   Interpretation (HIV Ag Ab)      A non reactive test result means that HIV 1 or HIV 2 antibodies and HIV 1 p24 antigen were not detected in the specimen.  CBC     Status: Abnormal   Collection Time: 03/12/18  4:08 PM  Result Value Ref Range   WBC 7.1 3.6 - 11.0 K/uL   RBC 4.06  3.80 - 5.20 MIL/uL   Hemoglobin 11.4 (L) 12.0 - 16.0 g/dL   HCT 11.9 (L) 14.7 - 82.9 %   MCV 81.0 80.0 - 100.0 fL   MCH 28.0  26.0 - 34.0 pg   MCHC 34.6 32.0 - 36.0 g/dL   RDW 56.2 (H) 13.0 - 86.5 %   Platelets 236 150 - 440 K/uL    Assessment : DURA MCCORMACK is a 27 y.o. [phone] at [redacted]w[redacted]d being admitted for successful VBAC at home.  No PNC this pregnancy, h/o depression in current pregnancy, h/o drug abuse. UTI in pregnancy.  Plan: Admitted to labor and delivery Administered IM pitocin.  Type and screen ordered. Remainder of prenatal labs previously ordered during yesterday's triage visit, pending, including GBS.  Will transfer to postpartum care.  Continue Zoloft Ccontinue antibiotics until culture returns (patient did not initiate meds yesterday)   Hildred Laser, MD [facility]

## 2018-03-13 NOTE — Progress Notes (Signed)
Patient's grandmother Christiana Fuchs(Mary Bossier [phone]) called to see if she knew where patient may be/if she had heard from her. Went to [employer], voicemail was full.

## 2018-03-13 NOTE — Progress Notes (Signed)
Patient requesting to walk outside to get fresh air with friend. Requested patient stay on 3rd floor. Patient agreed at this time.

## 2018-03-14 DIAGNOSIS — F332 Major depressive disorder, recurrent severe without psychotic features: Secondary | ICD-10-CM

## 2018-03-14 DIAGNOSIS — O2343 Unspecified infection of urinary tract in pregnancy, third trimester: Secondary | ICD-10-CM

## 2018-03-14 DIAGNOSIS — Z87898 Personal history of other specified conditions: Secondary | ICD-10-CM

## 2018-03-14 DIAGNOSIS — O9081 Anemia of the puerperium: Secondary | ICD-10-CM

## 2018-03-14 LAB — CBC
HEMATOCRIT: 27.2 % — AB (ref 35.0–47.0)
Hemoglobin: 9.1 g/dL — ABNORMAL LOW (ref 12.0–16.0)
MCH: 27.2 pg (ref 26.0–34.0)
MCHC: 33.3 g/dL (ref 32.0–36.0)
MCV: 81.5 fL (ref 80.0–100.0)
PLATELETS: 271 10*3/uL (ref 150–440)
RBC: 3.34 MIL/uL — AB (ref 3.80–5.20)
RDW: 15.7 % — AB (ref 11.5–14.5)
WBC: 7.1 10*3/uL (ref 3.6–11.0)

## 2018-03-14 LAB — CULTURE, BETA STREP (GROUP B ONLY)

## 2018-03-14 LAB — TYPE AND SCREEN
ABO/RH(D): A POS
Antibody Screen: NEGATIVE

## 2018-03-14 MED ORDER — FERROUS SULFATE 325 (65 FE) MG PO TABS: 325.0000 mg | ORAL_TABLET | Freq: Every day | ORAL | 2 refills | Status: DC

## 2018-03-14 MED ORDER — IBUPROFEN 800 MG PO TABS: 800.0000 mg | ORAL_TABLET | Freq: Three times a day (TID) | ORAL | 1 refills | Status: DC | PRN

## 2018-03-14 MED ORDER — DOCUSATE SODIUM 100 MG PO CAPS: 100.0000 mg | ORAL_CAPSULE | Freq: Every day | ORAL | 2 refills | Status: DC | PRN

## 2018-03-14 NOTE — Discharge Instructions (Signed)
Vaginal Delivery, Care After °Refer to this sheet in the next few weeks. These instructions provide you with information about caring for yourself after vaginal delivery. Your health care provider may also give you more specific instructions. Your treatment has been planned according to current medical practices, but problems sometimes occur. Call your health care provider if you have any problems or questions. °What can I expect after the procedure? °After vaginal delivery, it is common to have: °· Some bleeding from your vagina. °· Soreness in your abdomen, your vagina, and the area of skin between your vaginal opening and your anus (perineum). °· Pelvic cramps. °· Fatigue. ° °Follow these instructions at home: °Medicines °· Take over-the-counter and prescription medicines only as told by your health care provider. °· If you were prescribed an antibiotic medicine, take it as told by your health care provider. Do not stop taking the antibiotic until it is finished. °Driving ° °· Do not drive or operate heavy machinery while taking prescription pain medicine. °· Do not drive for 24 hours if you received a sedative. °Lifestyle °· Do not drink alcohol. This is especially important if you are breastfeeding or taking medicine to relieve pain. °· Do not use tobacco products, including cigarettes, chewing tobacco, or e-cigarettes. If you need help quitting, ask your health care provider. °Eating and drinking °· Drink at least 8 eight-ounce glasses of water every day unless you are told not to by your health care provider. If you choose to breastfeed your baby, you may need to drink more water than this. °· Eat high-fiber foods every day. These foods may help prevent or relieve constipation. High-fiber foods include: °? Whole grain cereals and breads. °? Brown rice. °? Beans. °? Fresh fruits and vegetables. °Activity °· Return to your normal activities as told by your health care provider. Ask your health care provider  what activities are safe for you. °· Rest as much as possible. Try to rest or take a nap when your baby is sleeping. °· Do not lift anything that is heavier than your baby or 10 lb (4.5 kg) until your health care provider says that it is safe. °· Talk with your health care provider about when you can engage in sexual activity. This may depend on your: °? Risk of infection. °? Rate of healing. °? Comfort and desire to engage in sexual activity. °Vaginal Care °· If you have an episiotomy or a vaginal tear, check the area every day for signs of infection. Check for: °? More redness, swelling, or pain. °? More fluid or blood. °? Warmth. °? Pus or a bad smell. °· Do not use tampons or douches until your health care provider says this is safe. °· Watch for any blood clots that may pass from your vagina. These may look like clumps of dark red, brown, or black discharge. °General instructions °· Keep your perineum clean and dry as told by your health care provider. °· Wear loose, comfortable clothing. °· Wipe from front to back when you use the toilet. °· Ask your health care provider if you can shower or take a bath. If you had an episiotomy or a perineal tear during labor and delivery, your health care provider may tell you not to take baths for a certain length of time. °· Wear a bra that supports your breasts and fits you well. °· If possible, have someone help you with household activities and help care for your baby for at least a few days after   you leave the hospital. °· Keep all follow-up visits for you and your baby as told by your health care provider. This is important. °Contact a health care provider if: °· You have: °? Vaginal discharge that has a bad smell. °? Difficulty urinating. °? Pain when urinating. °? A sudden increase or decrease in the frequency of your bowel movements. °? More redness, swelling, or pain around your episiotomy or vaginal tear. °? More fluid or blood coming from your episiotomy or  vaginal tear. °? Pus or a bad smell coming from your episiotomy or vaginal tear. °? A fever. °? A rash. °? Little or no interest in activities you used to enjoy. °? Questions about caring for yourself or your baby. °· Your episiotomy or vaginal tear feels warm to the touch. °· Your episiotomy or vaginal tear is separating or does not appear to be healing. °· Your breasts are painful, hard, or turn red. °· You feel unusually sad or worried. °· You feel nauseous or you vomit. °· You pass large blood clots from your vagina. If you pass a blood clot from your vagina, save it to show to your health care provider. Do not flush blood clots down the toilet without having your health care provider look at them. °· You urinate more than usual. °· You are dizzy or light-headed. °· You have not breastfed at all and you have not had a menstrual period for 12 weeks after delivery. °· You have stopped breastfeeding and you have not had a menstrual period for 12 weeks after you stopped breastfeeding. °Get help right away if: °· You have: °? Pain that does not go away or does not get better with medicine. °? Chest pain. °? Difficulty breathing. °? Blurred vision or spots in your vision. °? Thoughts about hurting yourself or your baby. °· You develop pain in your abdomen or in one of your legs. °· You develop a severe headache. °· You faint. °· You bleed from your vagina so much that you fill two sanitary pads in one hour. °This information is not intended to replace advice given to you by your health care provider. Make sure you discuss any questions you have with your health care provider. °Document Released: 07/01/2000 Document Revised: 12/16/2015 Document Reviewed: 07/19/2015 °Elsevier Interactive Patient Education © 2018 [employer] Inc[employer] ° °

## 2018-03-14 NOTE — Clinical Social Work Maternal (Addendum)
  CLINICAL SOCIAL WORK MATERNAL/CHILD NOTE  Patient Details  Name: Diana Richard MRN: [id number] Date of Birth: September 18, 1990  Date:  03/14/2018  Clinical Social Worker Initiating Note:  York SpanielMonica Arlene Richard MSW,LCSW Date/Time: Initiated:  03/14/18/      Child's Name:      Biological Parents:  Mother   Need for Interpreter:  None   Reason for Referral:  Current Substance Use/Substance Use During Pregnancy    Address:  [address] [place] [address]    Phone number:  [phone] (home)     Additional phone number: none  Household Members/Support Persons (HM/SP):       HM/SP Name Relationship DOB or Age  HM/SP -1        HM/SP -2        HM/SP -3        HM/SP -4        HM/SP -5        HM/SP -6        HM/SP -7        HM/SP -8          Natural Supports (not living in the home):      Professional Supports:     Employment:     Type of Work:     Education:      Homebound arranged:    Surveyor, quantityinancial Resources:  Medicaid   Other Resources:  English as a second language teacherood Stamps    Cultural/Religious Considerations Which May Impact Care:  none  Strengths:  Ability to meet basic needs , Home prepared for child    Psychotropic Medications:         Pediatrician:       [place]    [place]    [place]    [place]    [place]    [place]      Pediatrician Fax Number:    Risk Factors/Current Problems:  Substance Use    Cognitive State:  Alert , Able to Concentrate    Mood/Affect:  Calm , Bright , Happy    CSW Assessment: CSW consulted due to patient's mother admitting to using methamphetamines during her pregnancy. She stated she did not find out she was pregnant until 2 months ago and had been using prior to that. She stated she stopped as soon as she found out she was pregnant. As patient delivered at home, staff has attempted to see if the cord tissue was saved and it was not. Patient and newborn both tested negative  for all illicit substances. Patient has rotting teeth. As a result of patient admitting to using meth, CSW will make a DSS Child Protective Report. Patient reports to having two sons in the home with her along and lives with family.   CSW Plan/Description:  Child Protective Service Report     York SpanielMonica Sherlin Sonier, LCSW 03/14/2018, 10:09 AM

## 2018-03-14 NOTE — Progress Notes (Signed)
MD order for pt discharge home.  All discharge instructions given and pt understands all. F/u appt scheduled and pt aware.Pt viewed the DVD, The Period of Purple Cry.  RX called to pharmacy by MD.  Pt discharged home via wheelchair by auxillary staff.

## 2018-03-14 NOTE — Plan of Care (Signed)
VS stable; up ad lib; taking motrin and tylenol for pain control; did go once to see baby in SCN this shift

## 2018-03-14 NOTE — Progress Notes (Signed)
Lab tech in room to draw labs this morning; pt requested that they "come back around 7"

## 2018-03-14 NOTE — Progress Notes (Signed)
Mother viewed the DVD, "The Period of Purple Cry" and copy given for other family members at home to view as well.

## 2018-03-14 NOTE — Progress Notes (Signed)
Post Partum Day # 1, s/p SVD  Subjective: no complaints, up ad lib, voiding and tolerating PO  Objective: Temp:  [97.7 F (36.5 C)-98.2 F (36.8 C)] 98.1 F (36.7 C) (08/28 0823) Pulse Rate:  [82-88] 88 (08/28 0823) Resp:  [18-20] 18 (08/28 0823) BP: (110-143)/(82-90) 115/84 (08/28 0823) SpO2:  [98 %-100 %] 98 % (08/28 0823)  Physical Exam:  General: alert and no distress  Lungs: clear to auscultation bilaterally Breasts: normal appearance, no masses or tenderness Heart: regular rate and rhythm, S1, S2 normal, no murmur, click, rub or gallop Abdomen: soft, non-tender; bowel sounds normal; no masses,  no organomegaly Pelvis: Lochia: appropriate, Uterine Fundus: firm Extremities: DVT Evaluation: No evidence of DVT seen on physical exam. Negative Homan's sign. No cords or calf tenderness. No significant calf/ankle edema.  Recent Labs    03/12/18 1608 03/14/18 0735  HGB 11.4* 9.1*  HCT 32.9* 27.2*   Prenatal labs and studies: ABO, Rh: --/--/A POS (08/28 0735) Antibody: NEG (08/28 0735) Rubella: 2.46 (08/26 1608) RPR: Non Reactive (08/26 1608)  HBsAg: Negative (08/26 1608)  HIV: NON REACTIVE (08/26 1608)  GBS: Positive  (08/26 1608)  Varicella pending GC/CL: Negative   Assessment/Plan: Discharge home s/p Social work consult Mild anemia, asymptomatic postpartum.  Will treat with daily iron Infant to remain in the NICU for further monitoring.  Bottle feeding Desires OCPs for contraception. Continue Zoloft for depression in pregnancy. Given warning signs regarding worsening postpartum depression.  To f/u in 1-2 weeks postpartum.    LOS: 1 day   Hildred Laserherry, Porschea Borys, MD [facility] 03/14/2018 10:38 AM

## 2018-03-15 LAB — URINE CULTURE
Culture: >=100000 — AB
Special Requests: NORMAL

## 2018-03-17 NOTE — Discharge Summary (Signed)
OB Discharge Summary     Patient Name: Diana Richard DOB: 1991-04-17 MRN: [id number]  Date of admission: 03/13/2018 Delivering MD:     Date of discharge: 03/14/2018  Admitting diagnosis: delivered baby (home delivery) Intrauterine pregnancy: [redacted]w[redacted]d     Secondary diagnosis:  Active Problems:   Severe recurrent major depression without psychotic features (HCC)   No prenatal care in current pregnancy   History of drug abuse   History of cesarean delivery affecting pregnancy   History of oligohydramnios in prior pregnancy, currently pregnant, first trimester   UTI in pregnancy, antepartum, third trimester   Postpartum anemia  Additional problems: None     Discharge diagnosis: Preterm Pregnancy Delivered, Major depression, postpartum anemia, UTI in pregnancy                                                                                      Post partum procedures:None  Augmentation: None  Complications: None  Hospital course:  Onset of Labor With Vaginal Delivery     27 y.o. yo [phone] at [redacted]w[redacted]d was admitted immediately postpartum after having a home successful VBAC on 03/13/2018. Patient delivered the baby, EMS delivered the placenta in patient's home. Patient had an unknown labor course.  Intrapartum Procedures: Episiotomy:   None                                        Lacerations:    None Patient had a delivery of a Viable infant. 03/13/2018  Information for the patient's newborn:  Temika, Sutphin [[id number]]  Delivery Method: Vaginal, Spontaneous(Filed from Delivery Summary)    Pateint had an uncomplicated postpartum course.  She is ambulating, tolerating a regular diet, passing flatus, and urinating well. Patient is discharged home in stable condition on 03/17/18.   Physical exam  Vitals:   03/13/18 1546 03/13/18 1950 03/13/18 2354 03/14/18 0823  BP: 110/85 129/88 (!) 143/82 115/84  Pulse: 87 84 82 88  Resp: 20 18 18 18   Temp: 98.2 F (36.8 C)  97.7 F  (36.5 C) 98.1 F (36.7 C)  TempSrc: Oral  Oral Oral  SpO2: 100% 100% 99% 98%  Weight:      Height:       General: alert and no distress Lochia: appropriate Uterine Fundus: firm Incision: N/A DVT Evaluation: No evidence of DVT seen on physical exam. Negative Homan's sign. No cords or calf tenderness. No significant calf/ankle edema.    Labs:  CBC Latest Ref Rng & Units 03/14/2018 03/12/2018 03/07/2018  WBC 3.6 - 11.0 K/uL 7.1 7.1 7.9  Hemoglobin 12.0 - 16.0 g/dL 1.3(Y) 11.4(L) 11.8(L)  Hematocrit 35.0 - 47.0 % 27.2(L) 32.9(L) 34.9(L)  Platelets 150 - 440 K/uL 271 236 238    CMP Latest Ref Rng & Units 03/07/2018  Glucose 70 - 99 mg/dL 865(H)  BUN 6 - 20 mg/dL <8(I)  Creatinine 6.96 - 1.00 mg/dL 2.95  Sodium 284 - 132 mmol/L 137  Potassium 3.5 - 5.1 mmol/L 3.3(L)  Chloride 98 - 111 mmol/L 109  CO2 22 - 32 mmol/L  19(L)  Calcium 8.9 - 10.3 mg/dL 1.6(X)  Total Protein 6.5 - 8.1 g/dL 6.1(L)  Total Bilirubin 0.3 - 1.2 mg/dL 0.3  Alkaline Phos 38 - 126 U/L 131(H)  AST 15 - 41 U/L 20  ALT 0 - 44 U/L 14    Prenatal labs and studies: ABO, Rh: --/--/A POS (08/28 0735) Antibody: NEG (08/28 0735) Rubella: 2.46 (08/26 1608) RPR: Non Reactive (08/26 1608)  HBsAg: Negative (08/26 1608)  HIV: NON REACTIVE (08/26 1608)  GBS: Positive  (08/26 1608)  Varicella pending GC/CL: Negative  Admission on 03/12/2018, Discharged on 03/12/2018   Performed at [facility], [address], [place], [place] [address]  . Color, Urine 03/12/2018 YELLOW* YELLOW Final  . APPearance 03/12/2018 HAZY* CLEAR Final  . Specific Gravity, Urine 03/12/2018 1.008  1.005 - 1.030 Final  . pH 03/12/2018 6.0  5.0 - 8.0 Final  . Glucose, UA 03/12/2018 NEGATIVE  NEGATIVE mg/dL Final  . Hgb urine dipstick 03/12/2018 SMALL* NEGATIVE Final  . Bilirubin Urine 03/12/2018 NEGATIVE  NEGATIVE Final  . Ketones, ur 03/12/2018 NEGATIVE  NEGATIVE mg/dL Final  . Protein, ur 54/03/8118 NEGATIVE  NEGATIVE mg/dL Final   . Nitrite 14/78/2956 NEGATIVE  NEGATIVE Final  . Leukocytes, UA 03/12/2018 SMALL* NEGATIVE Final  . RBC / HPF 03/12/2018 0-5  0 - 5 RBC/hpf Final  . WBC, UA 03/12/2018 11-20  0 - 5 WBC/hpf Final  . Bacteria, UA 03/12/2018 MANY* NONE SEEN Final  . Squamous Epithelial / LPF 03/12/2018 6-10  0 - 5 Final  . Mucus 03/12/2018 PRESENT   Final  . Tricyclic, Ur Screen 03/12/2018 NONE DETECTED  NONE DETECTED Final  . Amphetamines, Ur Screen 03/12/2018 NONE DETECTED  NONE DETECTED Final  . MDMA (Ecstasy)Ur Screen 03/12/2018 NONE DETECTED  NONE DETECTED Final  . Cocaine Metabolite,Ur [place] 03/12/2018 NONE DETECTED  NONE DETECTED Final  . Opiate, Ur Screen 03/12/2018 NONE DETECTED  NONE DETECTED Final  . Phencyclidine (PCP) Ur S 03/12/2018 NONE DETECTED  NONE DETECTED Final  . Cannabinoid 50 Ng, Ur [place] 03/12/2018 NONE DETECTED  NONE DETECTED Final  . Barbiturates, Ur Screen 03/12/2018 NONE DETECTED  NONE DETECTED Final  . Benzodiazepine, Ur Scrn 03/12/2018 TEST NOT PERFORMED, REAGENT NOT AVAILABLE* NONE DETECTED Final  . Methadone Scn, Ur 03/12/2018 NONE DETECTED  NONE DETECTED Final   Comment: (NOTE) Tricyclics + metabolites, urine    Cutoff 1000 ng/mL Amphetamines + metabolites, urine  Cutoff 1000 ng/mL MDMA (Ecstasy), urine              Cutoff 500 ng/mL Cocaine Metabolite, urine          Cutoff 300 ng/mL Opiate + metabolites, urine        Cutoff 300 ng/mL Phencyclidine (PCP), urine         Cutoff 25 ng/mL Cannabinoid, urine                 Cutoff 50 ng/mL Barbiturates + metabolites, urine  Cutoff 200 ng/mL Benzodiazepine, urine              Cutoff 200 ng/mL Methadone, urine                   Cutoff 300 ng/mL The urine drug screen provides only a preliminary, unconfirmed analytical test result and should not be used for non-medical purposes. Clinical consideration and professional judgment should be applied to any positive drug screen result due to possible interfering substances. A more specific  alternate chemical method must be  used in order to obtain a confirmed analytical result. Gas chromatography / mass spectrometry (GC/MS) is the preferred confirmat                          ory method. Performed at [facility], [address] [place], [place] [address]                    Value:URINE, [facility] Performed at [facility], [address] [place], [place] [address]   . Special Requests 03/12/2018    Final                   Value:Normal Performed at [facility], [address] [place], [place] [address]   . Culture 03/12/2018 >=100,000 COLONIES/mL ESCHERICHIA COLI*  Final  . Report Status 03/12/2018 03/15/2018 FINAL   Final  . Organism ID, Bacteria 03/12/2018 ESCHERICHIA COLI*  Final    Discharge instruction: per After Visit Summary  After visit meds:  Allergies as of 03/14/2018   No Known Allergies     Medication List    TAKE these medications   CITRANATAL 90 DHA 90-1 & 300 MG Misc Take 1 tablet by mouth daily.   docusate sodium 100 MG capsule Commonly known as:  COLACE Take 1 capsule (100 mg total) by mouth daily as needed.   ferrous sulfate 325 (65 FE) MG tablet Take 1 tablet (325 mg total) by mouth daily with breakfast.   ibuprofen 800 MG tablet Commonly known as:  ADVIL,MOTRIN Take 1 tablet (800 mg total) by mouth every 8 (eight) hours as needed.   nitrofurantoin (macrocrystal-monohydrate) 100 MG capsule Commonly known as:  MACROBID Take 1 capsule (100 mg total) by mouth 2 (two) times daily for 7 days.   sertraline 50 MG tablet Commonly known as:  ZOLOFT Take 1 tablet (50 mg total) by mouth daily.       Diet: routine diet  Activity: Advance as tolerated. Pelvic rest for 6 weeks.   Outpatient follow up:2 weeks Follow up Appt: Future Appointments  Date Time Provider Department Center  03/21/2018  8:30 AM Hildred Laserherry, Delanda Bulluck, MD [facility] None   Follow up Visit:No follow-ups on file.  Postpartum contraception:  Combination OCPs  Newborn Data: Live born female  Birth Weight: 6 lb 15 oz (3147 g) APGAR: ,   Newborn Delivery   Birth date/time:  03/13/2018 02:07:00 Delivery type:  Vaginal, Spontaneous     Baby Feeding: Bottle Disposition:NICU   03/17/2018 Hildred LaserAnika Cyanne Delmar, MD

## 2018-03-21 ENCOUNTER — Encounter: Payer: Self-pay | Admitting: Obstetrics and Gynecology

## 2019-07-19 NOTE — L&D Delivery Note (Signed)
Delivery Note  Date of delivery: 11/07/2019 Estimated Date of Delivery: None noted - no prenatal care No LMP recorded. Patient is pregnant. EGA: Unknown  Delivery Note At 10:37 PM a viable female was delivered via Vaginal, Spontaneous (Presentation:  OA).  APGAR: 8, 9; weight  pending.  Placenta status: Spontaneous, Intact.  Cord:   with the following complications:  No prenatal care and precipitous delivery.    Anesthesia: None Episiotomy: None Lacerations:  none Suture Repair: n/a Est. Blood Loss (mL):  200  Mom to postpartum.  Baby to Couplet care / Skin to Skin.   First Stage: Labor onset: 2100 Augmentation : none Analgesia /Anesthesia intrapartum: none SROM at 2100  Revonda Humphrey presented to L&D with contractions.    Second Stage: Complete dilation at 2237 Onset of pushing at 2237 FHR second stage 120-170 Delivery at 2237 on 11/07/2019  She progressed to complete very quickly and had a spontaneous vaginal birth of a live female over an intact perineum. The fetal head was delivered in OA position with restitution to ROA.  No nuchal cord. Anterior then posterior shoulders delivered spontaneously with minimal assistance. Baby placed on mom's abdomen and attended to by transition RN. Cord clamped and cut after 1 min.  Cord blood obtained for newborn labs.  Third Stage: Placenta delivered intact with 3VC at 2243 Placenta disposition: Pathology Uterine tone firm / bleeding min IV pitocin given for hemorrhage prophylaxis  No laceration identified  Anesthesia for repair: n/a Repair n/a Est. Blood Loss (mL): 200  Complications: none  Newborn: Birth Weight: pending   Apgar Scores: 8/9 Feeding planned: Both   Cyril Mourning, [place] 11/07/2019 10:49 PM

## 2019-11-07 ENCOUNTER — Inpatient Hospital Stay
Admission: EM | Admit: 2019-11-07 | Discharge: 2019-11-09 | DRG: 806 | Disposition: A | Discharge status: Home / Self Care | Payer: Medicaid Other | Attending: Certified Nurse Midwife | Admitting: Certified Nurse Midwife

## 2019-11-07 ENCOUNTER — Other Ambulatory Visit: Payer: Self-pay

## 2019-11-07 DIAGNOSIS — O26893 Other specified pregnancy related conditions, third trimester: Secondary | ICD-10-CM | POA: Diagnosis present

## 2019-11-07 DIAGNOSIS — F332 Major depressive disorder, recurrent severe without psychotic features: Secondary | ICD-10-CM | POA: Diagnosis present

## 2019-11-07 DIAGNOSIS — O34219 Maternal care for unspecified type scar from previous cesarean delivery: Secondary | ICD-10-CM | POA: Diagnosis present

## 2019-11-07 DIAGNOSIS — O99344 Other mental disorders complicating childbirth: Secondary | ICD-10-CM | POA: Diagnosis present

## 2019-11-07 DIAGNOSIS — O9081 Anemia of the puerperium: Secondary | ICD-10-CM | POA: Diagnosis not present

## 2019-11-07 DIAGNOSIS — Z20822 Contact with and (suspected) exposure to covid-19: Secondary | ICD-10-CM | POA: Diagnosis present

## 2019-11-07 DIAGNOSIS — F17210 Nicotine dependence, cigarettes, uncomplicated: Secondary | ICD-10-CM | POA: Diagnosis present

## 2019-11-07 DIAGNOSIS — O99334 Smoking (tobacco) complicating childbirth: Secondary | ICD-10-CM | POA: Diagnosis present

## 2019-11-07 DIAGNOSIS — Z3A00 Weeks of gestation of pregnancy not specified: Secondary | ICD-10-CM | POA: Diagnosis not present

## 2019-11-07 LAB — CBC
HCT: 32.9 % — ABNORMAL LOW (ref 36.0–46.0)
Hemoglobin: 10.1 g/dL — ABNORMAL LOW (ref 12.0–15.0)
MCH: 23.5 pg — ABNORMAL LOW (ref 26.0–34.0)
MCHC: 30.7 g/dL (ref 30.0–36.0)
MCV: 76.5 fL — ABNORMAL LOW (ref 80.0–100.0)
Platelets: 349 10*3/uL (ref 150–400)
RBC: 4.3 MIL/uL (ref 3.87–5.11)
RDW: 15.7 % — ABNORMAL HIGH (ref 11.5–15.5)
WBC: 15.3 10*3/uL — ABNORMAL HIGH (ref 4.0–10.5)
nRBC: 0 % (ref 0.0–0.2)

## 2019-11-07 LAB — COMPREHENSIVE METABOLIC PANEL
ALT: 12 U/L (ref 0–44)
AST: 16 U/L (ref 15–41)
Albumin: 2.7 g/dL — ABNORMAL LOW (ref 3.5–5.0)
Alkaline Phosphatase: 116 U/L (ref 38–126)
Anion gap: 9 (ref 5–15)
BUN: 7 mg/dL (ref 6–20)
CO2: 20 mmol/L — ABNORMAL LOW (ref 22–32)
Calcium: 8.5 mg/dL — ABNORMAL LOW (ref 8.9–10.3)
Chloride: 106 mmol/L (ref 98–111)
Creatinine, Ser: 0.44 mg/dL (ref 0.44–1.00)
GFR calc Af Amer: >60 mL/min (ref >60)
GFR calc non Af Amer: >60 mL/min (ref >60)
Glucose, Bld: 112 mg/dL — ABNORMAL HIGH (ref 70–99)
Potassium: 3.9 mmol/L (ref 3.5–5.1)
Sodium: 135 mmol/L (ref 135–145)
Total Bilirubin: 0.4 mg/dL (ref 0.3–1.2)
Total Protein: 6.2 g/dL — ABNORMAL LOW (ref 6.5–8.1)

## 2019-11-07 LAB — TYPE AND SCREEN
ABO/RH(D): A POS
Antibody Screen: NEGATIVE

## 2019-11-07 LAB — RESPIRATORY PANEL BY RT PCR (FLU A&B, COVID)
Influenza A by PCR: NEGATIVE
Influenza B by PCR: NEGATIVE
SARS Coronavirus 2 by RT PCR: NEGATIVE

## 2019-11-07 MED ORDER — LACTATED RINGERS IV SOLN: INTRAVENOUS | Status: DC

## 2019-11-07 MED ORDER — ACETAMINOPHEN 325 MG PO TABS: 650.0000 mg | ORAL_TABLET | ORAL | Status: DC | PRN

## 2019-11-07 MED ORDER — MISOPROSTOL 200 MCG PO TABS
ORAL_TABLET | ORAL | Status: AC
  Filled 2019-11-07: qty 4

## 2019-11-07 MED ORDER — LIDOCAINE HCL (PF) 1 % IJ SOLN: 30.0000 mL | INTRAMUSCULAR | Status: DC | PRN

## 2019-11-07 MED ORDER — OXYCODONE-ACETAMINOPHEN 5-325 MG PO TABS: 2.0000 | ORAL_TABLET | ORAL | Status: DC | PRN

## 2019-11-07 MED ORDER — SOD CITRATE-CITRIC ACID 500-334 MG/5ML PO SOLN: 30.0000 mL | ORAL | Status: DC | PRN

## 2019-11-07 MED ORDER — OXYTOCIN BOLUS FROM INFUSION
500.0000 mL | Freq: Once | INTRAVENOUS | Status: AC
  Administered 2019-11-07: 23:00:00 500 mL via INTRAVENOUS

## 2019-11-07 MED ORDER — ONDANSETRON HCL 4 MG/2ML IJ SOLN: 4.0000 mg | Freq: Four times a day (QID) | INTRAMUSCULAR | Status: DC | PRN

## 2019-11-07 MED ORDER — LIDOCAINE HCL (PF) 1 % IJ SOLN
INTRAMUSCULAR | Status: AC
  Filled 2019-11-07: qty 30

## 2019-11-07 MED ORDER — OXYCODONE-ACETAMINOPHEN 5-325 MG PO TABS: 1.0000 | ORAL_TABLET | ORAL | Status: DC | PRN

## 2019-11-07 MED ORDER — LACTATED RINGERS IV SOLN: 500.0000 mL | INTRAVENOUS | Status: DC | PRN

## 2019-11-07 MED ORDER — OXYTOCIN 40 UNITS IN NORMAL SALINE INFUSION - SIMPLE MED
INTRAVENOUS | Status: AC
  Filled 2019-11-07: qty 1000

## 2019-11-07 MED ORDER — IBUPROFEN 600 MG PO TABS
600.0000 mg | ORAL_TABLET | Freq: Four times a day (QID) | ORAL | Status: DC
  Administered 2019-11-08 – 2019-11-09 (×6): 600 mg via ORAL
  Filled 2019-11-07 (×6): qty 1

## 2019-11-07 MED ORDER — OXYTOCIN 40 UNITS IN NORMAL SALINE INFUSION - SIMPLE MED: 2.5000 [IU]/h | INTRAVENOUS | Status: DC

## 2019-11-07 MED ORDER — SODIUM CHLORIDE 0.9 % IV SOLN: 2.0000 g | Freq: Once | INTRAVENOUS | Status: DC

## 2019-11-07 NOTE — H&P (Signed)
OB History & Physical   History of Present Illness:  Chief Complaint:   HPI:  Diana Richard is a 29 y.o. [phone] female at Unknown gestation.  She presents to L&D for contractions.  She reports:  -active fetal movement -SROM at home -no vaginal bleeding -onset of contractions at 2100 currently every 1-2 minutes  Pregnancy Issues: 1. No prenatal care 2. History of drug abuse 3. Severe recurrent major depression 4. Pervious c/s x 1   Maternal Medical History:   Past Medical History:  Diagnosis Date  . Anxiety   . Depression     Past Surgical History:  Procedure Laterality Date  . CESAREAN SECTION N/A 10/04/2016   Procedure: CESAREAN SECTION;  Surgeon: Suzy Bouchard, MD;  Location: [facility] ORS;  Service: Obstetrics;  Laterality: N/A;  . LUMBAR LAMINECTOMY/DECOMPRESSION MICRODISCECTOMY Left 10/11/2012   Procedure: LUMBAR LAMINECTOMY/DECOMPRESSION MICRODISCECTOMY 1 LEVEL;  Surgeon: Tia Alert, MD;  Location: [facility];  Service: Neurosurgery;  Laterality: Left;  left lumbar four-five  . LUMBAR LAMINECTOMY/DECOMPRESSION MICRODISCECTOMY Right 06/07/2013   Procedure: Right Lumbar five-sacral one microdiskectomy ;  Surgeon: Tia Alert, MD;  Location: [facility];  Service: Neurosurgery;  Laterality: Right;  Right Lumbar five-sacral one microdiskectomy   . TONSILLECTOMY      No Known Allergies  Prior to Admission medications   Medication Sig Start Date End Date Taking? Authorizing Provider  docusate sodium (COLACE) 100 MG capsule Take 1 capsule (100 mg total) by mouth daily as needed. 03/14/18   Hildred Laser, MD  ferrous sulfate (FERROUSUL) 325 (65 FE) MG tablet Take 1 tablet (325 mg total) by mouth daily with breakfast. 03/14/18   Hildred Laser, MD  ibuprofen (ADVIL,MOTRIN) 800 MG tablet Take 1 tablet (800 mg total) by mouth every 8 (eight) hours as needed. 03/14/18   Hildred Laser, MD  Prenat w/o A-[id number]-[id number]-FA-DHA (CITRANATAL 90 DHA) 90-1 & 300 MG MISC Take 1  tablet by mouth daily. Patient not taking: Reported on 03/13/2018 03/12/18   Hildred Laser, MD  sertraline (ZOLOFT) 50 MG tablet Take 1 tablet (50 mg total) by mouth daily. 03/07/18 05/06/18  Clapacs, Jackquline Denmark, MD     Prenatal care site: No prenatal care  Social History: She  reports that she has been smoking cigarettes. She has a 1.50 pack-year smoking history. She has never used smokeless tobacco. She reports that she does not drink alcohol or use drugs.  Family History: family history is not on file.   Review of Systems: A full review of systems was performed and negative except as noted in the HPI.    Physical Exam:  Vital Signs: BP 132/75 (BP Location: Right Arm)   Pulse 75   Temp 98.3 F (36.8 C) (Oral)   Resp 18   Ht 5\' 6"  (1.676 m)   Wt 81.6 kg   SpO2 98%   BMI 29.05 kg/m   General:   alert and cooperative  Skin:  normal  Neurologic:    Alert & oriented x 3  Lungs:   nl effort  Heart:   regular rate and rhythm  Abdomen:  gravid  Extremities: : non-tender, symmetric     Results for orders placed or performed during the hospital encounter of 11/07/19 (from the past 24 hour(s))  Respiratory Panel by RT PCR (Flu A&B, Covid) - Nasopharyngeal Swab     Status: None   Collection Time: 11/07/19 10:21 PM   Specimen: Nasopharyngeal Swab  Result Value Ref Range   SARS  Coronavirus 2 by RT PCR NEGATIVE NEGATIVE   Influenza A by PCR NEGATIVE NEGATIVE   Influenza B by PCR NEGATIVE NEGATIVE  CBC     Status: Abnormal   Collection Time: 11/07/19 10:21 PM  Result Value Ref Range   WBC 15.3 (H) 4.0 - 10.5 K/uL   RBC 4.30 3.87 - 5.11 MIL/uL   Hemoglobin 10.1 (L) 12.0 - 15.0 g/dL   HCT 32.9 (L) 36.0 - 46.0 %   MCV 76.5 (L) 80.0 - 100.0 fL   MCH 23.5 (L) 26.0 - 34.0 pg   MCHC 30.7 30.0 - 36.0 g/dL   RDW 15.7 (H) 11.5 - 15.5 %   Platelets 349 150 - 400 K/uL   nRBC 0.0 0.0 - 0.2 %  Type and screen [place]     Status: None   Collection Time: 11/07/19 10:21 PM   Result Value Ref Range   ABO/RH(D) A POS    Antibody Screen NEG    Sample Expiration      11/10/2019,2359 Performed at [facility], [address] [place], [place] [address]   Comprehensive metabolic panel     Status: Abnormal   Collection Time: 11/07/19 10:21 PM  Result Value Ref Range   Sodium 135 135 - 145 mmol/L   Potassium 3.9 3.5 - 5.1 mmol/L   Chloride 106 98 - 111 mmol/L   CO2 20 (L) 22 - 32 mmol/L   Glucose, Bld 112 (H) 70 - 99 mg/dL   BUN 7 6 - 20 mg/dL   Creatinine, Ser 0.44 0.44 - 1.00 mg/dL   Calcium 8.5 (L) 8.9 - 10.3 mg/dL   Total Protein 6.2 (L) 6.5 - 8.1 g/dL   Albumin 2.7 (L) 3.5 - 5.0 g/dL   AST 16 15 - 41 U/L   ALT 12 0 - 44 U/L   Alkaline Phosphatase 116 38 - 126 U/L   Total Bilirubin 0.4 0.3 - 1.2 mg/dL   GFR calc non Af Amer >60 >60 mL/min   GFR calc Af Amer >60 >60 mL/min   Anion gap 9 5 - 15  Group B strep by PCR     Status: Abnormal   Collection Time: 11/07/19 10:21 PM   Specimen: Vaginal/Rectal; Genital  Result Value Ref Range   Group B strep by PCR POSITIVE (A) NEGATIVE  Chlamydia/NGC rt PCR ([facility] only)     Status: None   Collection Time: 11/07/19 10:21 PM   Specimen: Vaginal/Rectal  Result Value Ref Range   Specimen source GC/Chlam ENDOCERVICAL    Chlamydia Tr NOT DETECTED NOT DETECTED   N gonorrhoeae NOT DETECTED NOT DETECTED  Rapid HIV screen [facility] L&D dept ONLY)     Status: None   Collection Time: 11/07/19 10:21 PM  Result Value Ref Range   HIV-1 P24 Antigen - HIV24 NON REACTIVE NON REACTIVE   HIV 1/2 Antibodies NON REACTIVE NON REACTIVE   Interpretation (HIV Ag Ab)      A non reactive test result means that HIV 1 or HIV 2 antibodies and HIV 1 p24 antigen were not detected in the specimen.  OB RESULT CONSOLE Group B Strep     Status: None   Collection Time: 11/08/19 12:00 AM  Result Value Ref Range   GBS Positive   OB RESULTS CONSOLE GC/Chlamydia     Status: None   Collection Time: 11/08/19 12:00 AM  Result Value Ref  Range   Gonorrhea Negative    Chlamydia Negative  Pertinent Results:  Prenatal Labs: None done during pregnancy   FHT: FHR: 155 bpm, variability: moderate,  accelerations: difficult to tell,  decelerations: difficult to tell Category/reactivity:  Category I and Category II TOCO: regular, every 1-2 minutes SVE: Dilation: 10 / Effacement (%): 100 / Station: 0     Assessment:  Diana Richard is a 29 y.o. [phone] female at Unknown with contractions and precip delivery.   Plan:  1. Admit to Labor & Delivery; consents reviewed and obtained  2. Fetal Well being  - Fetal Tracing: Cat I and II - GBS unknown at delivery - Presentation: vtx confirmed by SVE   3. Routine OB: - Prenatal labs reviewed, as above - Rh pos - CBC & T&S on admit - Clear fluids, IVF  4. Monitoring of Labor -  Contractions external toco in place -  Plan for continuous fetal monitoring  - Anticipate vaginal delivery  5. Post Partum Planning: - Infant feeding: both - Contraception: TBD  Delonna Ney, CNM 11/08/2019 3:10 AM

## 2019-11-07 NOTE — Discharge Summary (Signed)
Obstetrical Discharge Summary  Patient Name: Diana Richard DOB: [birth year] MRN: [id number]  Date of Admission: 11/07/2019 Date of Delivery: 11/07/19 Delivered by: Diana Richard CNM Date of Discharge: 11/09/2019  Primary OB: No prenatal care LMP:No LMP recorded. EDC Estimated Date of Delivery: None noted. Gestational Age at Delivery: Unknown   Antepartum complications:  1. No prenatal care 2. History of drug abuse 3. Severe recurrent major depression 4. Pervious c/s x 1  Admitting Diagnosis: Labor Secondary Diagnosis: Patient Active Problem List   Diagnosis Date Noted  . Normal labor 11/07/2019  . History of drug abuse (HCC) 03/13/2018  . Severe recurrent major depression without psychotic features 03/07/2018    Augmentation: none Complications: None  Intrapartum complications/course:  Delivery Type: spontaneous vaginal delivery Anesthesia: none Placenta: spontaneous Laceration: none Episiotomy: none Newborn Data: Live born female  Birth Weight: 5lb13.5oz 2650g  APGAR: 8, 9  Newborn Delivery   Birth date/time: 11/07/2019 22:37:00 Delivery type: Vaginal, Spontaneous      [name] at unknown gestation presenting with contractions, SROM with clear fluid at home.  She quickly progressed to complete and pushed over an intact perineum and delivered the fetal head, followed promptly by the shoulders. The baby placed on the maternal abdomen. Delayed cord clamping and cord was cut, while she was skin to skin. The placenta delivered spontaneously and intact. No laceration.  Mom and baby tolerated the procedure well.   Postpartum Procedures: none  Post partum course:  Patient had an uncomplicated postpartum course.  By time of discharge on PPD#2, her pain was controlled on oral pain medications; she had appropriate lochia and was ambulating, voiding without difficulty and tolerating regular diet.  She was deemed stable for discharge to home.    Discharge Physical  Exam:  BP 114/69 (BP Location: Left Arm)   Pulse 70   Temp 98.7 F (37.1 C) (Oral)   Resp 16   Ht 5\' 6"  (1.676 m)   Wt 81.6 kg   SpO2 100%   Breastfeeding Unknown   BMI 29.05 kg/m   General: alert and no distress Pulm: normal respiratory effort Lochia: appropriate Abdomen: soft, NT Uterine Fundus: firm, below umbilicus Extremities: No evidence of DVT seen on physical exam. No lower extremity edema.  Edinburgh Postnatal Depression Scale Screening Tool 11/08/2019 03/14/2018  I have been able to laugh and see the funny side of things. (No Data) 0  I have looked forward with enjoyment to things. - 0  I have blamed myself unnecessarily when things went wrong. - 0  I have been anxious or worried for no good reason. - 0  I have felt scared or panicky for no good reason. - 0  Things have been getting on top of me. - 0  I have been so unhappy that I have had difficulty sleeping. - 0  I have felt sad or miserable. - 0  I have been so unhappy that I have been crying. - 0  The thought of harming myself has occurred to me. - 0  Edinburgh Postnatal Depression Scale Total - 0     Labs: CBC Latest Ref Rng & Units 11/08/2019 11/07/2019 03/14/2018  WBC 4.0 - 10.5 K/uL 14.1(H) 15.3(H) 7.1  Hemoglobin 12.0 - 15.0 g/dL 03/16/2018) 10.1(L) 9.1(L)  Hematocrit 36.0 - 46.0 % 32.1(L) 32.9(L) 27.2(L)  Platelets 150 - 400 K/uL 292 349 271   A POS Hemoglobin  Date Value Ref Range Status  11/08/2019 9.9 (L) 12.0 - 15.0 g/dL Final  HGB  Date Value Ref Range Status  03/09/2014 14.1 12.0 - 16.0 g/dL Final   HCT  Date Value Ref Range Status  11/08/2019 32.1 (L) 36.0 - 46.0 % Final  03/11/2014 33.1 (L) 35.0 - 47.0 % Final    Disposition: stable, discharge to home Baby Feeding:  formula Baby Disposition: DSS custody  Contraception: TBD, thinking IUD  Prenatal Labs:  Blood type/Rh A+  Antibody screen negative  Rubella pending  Varicella immune  RPR pending  HBsAg NR  HIV NR  GC  negative  Chlamydia negative  Genetic screening Not done  1 hour GTT Not done  3 hour GTT Not done  GBS positive   Rh Immune globulin given: n/a  Plan: Diana Richard was discharged to home in good condition. Follow-up appointment with delivering provider in 4-6 weeks.  Discharge Instructions: Per After Visit Summary. Activity: Advance as tolerated. Pelvic rest for 6 weeks.   Diet: Regular Discharge Medications: Allergies as of 11/09/2019   No Known Allergies     Medication List    STOP taking these medications   CitraNatal 90 DHA 90-1 & 300 MG Misc   docusate sodium 100 MG capsule Commonly known as: COLACE     TAKE these medications   ferrous sulfate 325 (65 FE) MG tablet Take 1 tablet (325 mg total) by mouth daily with breakfast.   ibuprofen 600 MG tablet Commonly known as: ADVIL Take 1 tablet (600 mg total) by mouth every 6 (six) hours as needed for mild pain, moderate pain or cramping. What changed:   medication strength  how much to take  when to take this  reasons to take this   PNV Folic Acid + Iron 42-8 MG Tabs Take 1 tablet by mouth daily.   sertraline 50 MG tablet Commonly known as: Zoloft Take 1 tablet (50 mg total) by mouth daily.      Outpatient follow up:  Follow-up [facility]. Schedule an appointment as soon as possible for a visit in 4 week(s).   Why: For routine postpartum visit Contact information: [place] [phone]          Signed: Lisette Richard, [facility] 11/09/2019 8:39 AM

## 2019-11-08 LAB — GROUP B STREP BY PCR: Group B strep by PCR: POSITIVE — AB

## 2019-11-08 LAB — CBC
HCT: 32.1 % — ABNORMAL LOW (ref 36.0–46.0)
Hemoglobin: 9.9 g/dL — ABNORMAL LOW (ref 12.0–15.0)
MCH: 23.2 pg — ABNORMAL LOW (ref 26.0–34.0)
MCHC: 30.8 g/dL (ref 30.0–36.0)
MCV: 75.2 fL — ABNORMAL LOW (ref 80.0–100.0)
Platelets: 292 10*3/uL (ref 150–400)
RBC: 4.27 MIL/uL (ref 3.87–5.11)
RDW: 15.6 % — ABNORMAL HIGH (ref 11.5–15.5)
WBC: 14.1 10*3/uL — ABNORMAL HIGH (ref 4.0–10.5)
nRBC: 0 % (ref 0.0–0.2)

## 2019-11-08 LAB — URINE DRUG SCREEN, QUALITATIVE (ARMC ONLY)
Amphetamines, Ur Screen: POSITIVE — AB
Barbiturates, Ur Screen: NOT DETECTED
Benzodiazepine, Ur Scrn: NOT DETECTED
Cannabinoid 50 Ng, Ur ~~LOC~~: POSITIVE — AB
Cocaine Metabolite,Ur ~~LOC~~: NOT DETECTED
MDMA (Ecstasy)Ur Screen: NOT DETECTED
Methadone Scn, Ur: NOT DETECTED
Opiate, Ur Screen: NOT DETECTED
Phencyclidine (PCP) Ur S: NOT DETECTED
Tricyclic, Ur Screen: NOT DETECTED

## 2019-11-08 LAB — CHLAMYDIA/NGC RT PCR (ARMC ONLY)
Chlamydia Tr: NOT DETECTED
N gonorrhoeae: NOT DETECTED

## 2019-11-08 LAB — RAPID HIV SCREEN (HIV 1/2 AB+AG)
HIV 1/2 Antibodies: NONREACTIVE
HIV-1 P24 Antigen - HIV24: NONREACTIVE

## 2019-11-08 LAB — OB RESULTS CONSOLE GBS: GBS: POSITIVE

## 2019-11-08 LAB — OB RESULTS CONSOLE GC/CHLAMYDIA
Chlamydia: NEGATIVE
Gonorrhea: NEGATIVE

## 2019-11-08 LAB — HEPATITIS B SURFACE ANTIGEN: Hepatitis B Surface Ag: NONREACTIVE

## 2019-11-08 MED ORDER — WITCH HAZEL-GLYCERIN EX PADS: 1.0000 "application " | MEDICATED_PAD | CUTANEOUS | Status: DC | PRN

## 2019-11-08 MED ORDER — DOCUSATE SODIUM 100 MG PO CAPS
100.0000 mg | ORAL_CAPSULE | Freq: Two times a day (BID) | ORAL | Status: DC
  Administered 2019-11-08 (×3): 100 mg via ORAL
  Filled 2019-11-08 (×3): qty 1

## 2019-11-08 MED ORDER — FERROUS SULFATE 325 (65 FE) MG PO TABS
325.0000 mg | ORAL_TABLET | Freq: Two times a day (BID) | ORAL | Status: DC
  Administered 2019-11-08 – 2019-11-09 (×3): 325 mg via ORAL
  Filled 2019-11-08 (×3): qty 1

## 2019-11-08 MED ORDER — TETANUS-DIPHTH-ACELL PERTUSSIS 5-2.5-18.5 LF-MCG/0.5 IM SUSP: 0.5000 mL | Freq: Once | INTRAMUSCULAR | Status: DC

## 2019-11-08 MED ORDER — COCONUT OIL OIL: 1.0000 "application " | TOPICAL_OIL | Status: DC | PRN

## 2019-11-08 MED ORDER — ONDANSETRON HCL 4 MG PO TABS: 4.0000 mg | ORAL_TABLET | ORAL | Status: DC | PRN

## 2019-11-08 MED ORDER — SIMETHICONE 80 MG PO CHEW: 80.0000 mg | CHEWABLE_TABLET | ORAL | Status: DC | PRN

## 2019-11-08 MED ORDER — ONDANSETRON HCL 4 MG/2ML IJ SOLN: 4.0000 mg | INTRAMUSCULAR | Status: DC | PRN

## 2019-11-08 MED ORDER — ACETAMINOPHEN 325 MG PO TABS
650.0000 mg | ORAL_TABLET | ORAL | Status: DC | PRN
  Administered 2019-11-08: 650 mg via ORAL
  Filled 2019-11-08 (×2): qty 2

## 2019-11-08 MED ORDER — BENZOCAINE-MENTHOL 20-0.5 % EX AERO
1.0000 "application " | INHALATION_SPRAY | CUTANEOUS | Status: DC | PRN
  Filled 2019-11-08: qty 56

## 2019-11-08 MED ORDER — DIPHENHYDRAMINE HCL 25 MG PO CAPS: 25.0000 mg | ORAL_CAPSULE | Freq: Four times a day (QID) | ORAL | Status: DC | PRN

## 2019-11-08 MED ORDER — PRENATAL MULTIVITAMIN CH
1.0000 | ORAL_TABLET | Freq: Every day | ORAL | Status: DC
  Administered 2019-11-08: 12:00:00 1 via ORAL
  Filled 2019-11-08: qty 1

## 2019-11-08 MED ORDER — DIBUCAINE (PERIANAL) 1 % EX OINT: 1.0000 "application " | TOPICAL_OINTMENT | CUTANEOUS | Status: DC | PRN

## 2019-11-08 NOTE — Progress Notes (Signed)
Margaretmary Eddy, CNM updated on call from Child psychotherapist and plan of care.

## 2019-11-08 NOTE — Clinical Social Work Maternal (Addendum)
CLINICAL SOCIAL WORK MATERNAL/CHILD NOTE  Patient Details  Name: Diana Richard MRN: [id number] Date of Birth: 05/07/1991  Date:  11/08/2019  Clinical Social Worker Initiating Note:  Leland Raver, LCSW Date/Time: Initiated:  11/08/19/      Child's Name:  Undecided, last name is Richard   Biological Parents:  Mother, Father   Need for Interpreter:  None   Reason for Referral:  Current Substance Use/Substance Use During Pregnancy , Late or No Prenatal Care    Address:  [address] [place] [address]    Phone number:  [phone] (home)     Additional phone number: *[phone]* -- MOB reported this is the primary contact # for her, added to chart.  Household Members/Support Persons (HM/SP):   Household Member/Support Person 1   HM/SP Name Relationship DOB or Age  HM/SP -1 Diana Richard FOB unknown  HM/SP -2        HM/SP -3        HM/SP -4        HM/SP -5        HM/SP -6        HM/SP -7        HM/SP -8          Natural Supports (not living in the home):  Immediate Family, Friends, Spouse/significant other   Professional Supports:     Employment: Unemployed   Type of Work:     Education:  Some College   Homebound arranged:    Financial Resources:  Medicaid   Other Resources:  Food Stamps    Cultural/Religious Considerations Which May Impact Care:  None reported  Strengths:  Pediatrician chosen, Compliance with medical plan    Psychotropic Medications:         Pediatrician:    [place]  Pediatrician List:   [place]    [place]    [place] [facility](Says she plans to use [facility] in [place].)  [place]    [place]    [place]      Pediatrician Fax Number:    Risk Factors/Current Problems:  Substance Use , Basic Needs    Cognitive State:  Alert , Able to Concentrate , Goal Oriented    Mood/Affect:  Calm , Happy    CSW Assessment: CSW was consulted due to MOB having a  positive UDS for Amphetamines and Marijuana. Per chart review, MOB also had no PNC during this pregnancy. MOB also has a history of severe depression. Unclear if she is currently on Subutex treatment. CSW spoke with RN Diana Richard prior to meeting with MOB for assessment.   Per RN, MOB and Baby will have to stay for 48 hours for medical reasons, so will likely be discharged on Sunday 4/25 if they are both doing well.   CSW met with MOB at bedside. Explained HIPPA. FOB (Diana Richard) was sleeping throughout assessment. MOB elected for FOB to remain in room during this assessment. MOB was alert, oriented, and answered questions appropriately. MOB was also attentive to Baby during assessment.   CSW explained Hospital Drug Screen and CPS Report Policy. MOB verbalized understanding. MOB reported she is not currently on Subutex. She reported that it has been about 3-4 years since she was on Subutex. MOB denied any substance use during pregnancy with this Baby. MOB verbalized understanding that Baby is being drug screened and if Baby's UDS or CDS comes back positive, a CPS report will be made per policy.   MOB reported   she has a history with CPS and that her 4 other children are in foster care placement. She reported they have been in foster care since around December of 2020. Her other children are: Diana Richard (5), Diana Richard (4), Diana Richard (3), and Diana Richard (1). MOB reported she thinks their CPS Worker's name is Diana Richard. MOB confirmed it is [place] CPS that is involved with her other kids. MOB is still deciding on a name for this Baby and says Baby's last name will be Richard.   MOB reported she feels she has a good support system, especially her grandmother. MOB reported she has her license, but does not drive. She reported her grandmother provides her with transportation. MOB does not have a car seat or bassinet/pack and play/crib for Baby yet. She reported her grandmother is going to purchase a new car seat  and a pack and play or bassinet for Baby. MOB reported she will most likely use [facility] in [place] for Baby's medical care.   MOB reported she has had depression "on and off" since she was in her teens. MOB reported she took a medication for depression short-term after her daughter Diana Richard was born, but is not currently on any mental health medications. MOB reported she saw a counselor briefly in her teenage years, but is not currently involved in therapy. MOB reported she is aware of resources if she feels she needs emotional support.   CSW provided education and information sheets on PPD and SIDS. MOB verbalized understanding. MOB denied any PPD history with her other children. CSW encouraged MOB to reach out to her Provider for any questions or needs for support, even after discharge. She agreed. MOB denied SI, HI, or DV.   Per RN, Baby has not urinated yet so still waiting on UDS to be collected. CDS is pending. CSW will follow both drug screens for results and make CPS report if warranted per hospital policy.  CSW encouraged MOB to reach out with any questions or needs, she agreed.     CSW Plan/Description:  Psychosocial Support and Ongoing Assessment of Needs, Sudden Infant Death Syndrome (SIDS) Education, Perinatal Mood and Anxiety Disorder (PMADs) Education, Hospital Drug Screen Policy Information, CSW Will Continue to Monitor Umbilical Cord Tissue Drug Screen Results and Make Report if Warranted, CSW Awaiting CPS Disposition Plan    Diana Mezera E Aisea Bouldin, LCSW 11/08/2019, 11:03 AM 

## 2019-11-08 NOTE — Progress Notes (Signed)
CPS worker Delaney Meigs in room to discuss plan of care with patient. Pt and father of baby calm and cooperative.

## 2019-11-08 NOTE — Progress Notes (Signed)
Infant taken out of patient's room at 1925 by Lincon Sahlin, RN to be observed in nursery. Parents aware.  

## 2019-11-08 NOTE — Progress Notes (Signed)
Post Partum 1  Subjective: no complaints, up ad lib, voiding and + flatus  Doing well, no concerns. Ambulating without difficulty, pain managed with PO meds, tolerating regular diet, and voiding without difficulty.   No fever/chills, chest pain, shortness of breath, nausea/vomiting, or leg pain. No nipple or breast pain. No headache, visual changes, or RUQ/epigastric pain.  Objective: BP 116/61 (BP Location: Right Arm)   Pulse (!) 54   Temp 98 F (36.7 C) (Oral)   Resp 15   Ht 5\' 6"  (1.676 m)   Wt 81.6 kg   SpO2 96%   BMI 29.05 kg/m    Physical Exam:  General: alert, cooperative and no distress Breasts: soft/nontender CV: RRR Pulm: nl effort, CTABL Abdomen: soft, non-tender, active bowel sounds Uterine Fundus: firm Perineum: minimal edema, intact Lochia: appropriate DVT Evaluation: No evidence of DVT seen on physical exam.  Recent Labs    11/07/19 2221 11/08/19 0634  HGB 10.1* 9.9*  HCT 32.9* 32.1*  WBC 15.3* 14.1*  PLT 349 292    Assessment/Plan: 29 y.o. 30 postpartum day # 1  -Continue routine postpartum care -Social work consult d/t no prenatal care.  Not currently taking subutex, states she stopped 4 years ago. Did not receive prenatal care with the pregnancy.  States "it was my fifth so I figured I was good."  Does not have custody of her other 4 children.  Will consult with social work for discharge planning and infant needs.   -Encouraged snug fitting bra, cold application, Tylenol PRN, and cabbage leaves for engorgement for formula feeding  -Discussed contraceptive options including implant, IUDs hormonal and non-hormonal, injection, pills/ring/patch, condoms, and NFP.  -Postpartum anemia - hemodynamically stable and asymptomatic; start PO ferrous sulfate BID with stool softeners  -Immunization status: Tdap up to date, rubella pending   Disposition: Continue inpatient postpartum care    LOS: 1 day   [id number], CNM 11/08/2019, 11:16 AM    ----- 11/10/2019  Certified Nurse Midwife [facility]

## 2019-11-08 NOTE — Progress Notes (Signed)
Received return call from CPS Intake Worker Diana Richard.   Diana Richard confirmed MOB's other children are in foster care. [facility] Worker is Diana Richard [phone]). Diana Richard is letting Diana Richard and the CPS Supervisor know about Diana Richard being born and the below updates:   CSW informed Diana Richard of Diana Richard being born, MOB's positive UDS, waiting on UDS for Baby and will report any positive results (weekend CSW aware to follow for UDS results if needed), MOB with no PNC, and that MOB does not have items needed for baby yet (somewhere to sleep or car seat).   Informed Diana Richard that per RN, MOB and Baby will likely discharge on Sunday. Asked for a return call if there are any barriers to discharge.   CSW will continue to follow.   Alfonso Ramus, [place] [phone]

## 2019-11-08 NOTE — Progress Notes (Addendum)
Call from [facility] with [facility] CPS. He reported he spoke with CPS staff and MOB's parental rights have been terminated on her other children due to ongoing substance use of MOB. He reported he is going to go ahead and submit a report before Baby's UDS is back because of the family's history. CPS will still need to be informed of Baby's UDS results.  Diana Richard reported a Stage manager will be out tonight for an assessment. He expects they will get a court order for Baby to not discharge to MOB, but an assessment will need to be done first before this can be determined.    Informed Diana Richard that Pecola Leisure is currently in room with MOB, he said an assessment will need to be completed for now no concerns expressed about Baby being in room with MOB.  CPS Worker assigned is Diana Richard [phone])  This weekend the CPS contact person is Diana Richard [phone]) -- this rotates each weekend.   CSW provided contact information to [place] for weekend TOC coverage Diana Richard). Diana Richard to relay this to CPS staff invovled in this case.  This CSW is updated Weekend CSW Diana Richard, Diana Richard, and Diana Richard.    Diana Richard, [place] [phone]

## 2019-11-08 NOTE — Progress Notes (Addendum)
CSW attempted call to [place] CPS due to MOB's other children being in foster care and to inform CPS that MOB has delivered this Baby. Left a voicemail requesting a return call to follow up on if any barriers to discharge for this Baby.  Still waiting on Baby's UDS to be collected per RN. Informed Weekend CSW who will monitor UDS results if they do not come back today.  Jayveon Convey, LCSW [phone]  

## 2019-11-08 NOTE — Progress Notes (Signed)
Diana Richard, CNM asks pt use of subutex. Pt states she has not taken subutex in years.

## 2019-11-09 ENCOUNTER — Encounter: Payer: Self-pay | Admitting: Obstetrics and Gynecology

## 2019-11-09 LAB — RPR: RPR Ser Ql: NONREACTIVE

## 2019-11-09 LAB — RUBELLA SCREEN: Rubella: 2.48 index (ref >0.99)

## 2019-11-09 MED ORDER — IBUPROFEN 600 MG PO TABS: 600.0000 mg | ORAL_TABLET | Freq: Four times a day (QID) | ORAL | 0 refills | Status: AC | PRN

## 2019-11-09 MED ORDER — PNV FOLIC ACID + IRON 27-1 MG PO TABS: 1.0000 | ORAL_TABLET | Freq: Every day | ORAL | 3 refills | Status: AC

## 2019-11-09 MED ORDER — FERROUS SULFATE 325 (65 FE) MG PO TABS: 325.0000 mg | ORAL_TABLET | Freq: Every day | ORAL | 0 refills | Status: AC

## 2019-11-09 MED ORDER — MEDROXYPROGESTERONE ACETATE 150 MG/ML IM SUSP
150.0000 mg | INTRAMUSCULAR | Status: DC | PRN
  Filled 2019-11-09: qty 1

## 2019-11-09 NOTE — Progress Notes (Signed)
Spoke with on-call DSS social worker Kynadee Dam [phone] regarding DSS case for patient Diana Richard and her baby girl, Cadence. [name] states that from her understanding, DSS does not have official custody of baby Cadence at this time (no court order was obtained), but a case has been opened related to drug use during pregnancy and positive urine drug screen from baby. DSS is requesting that baby stay in the hospital until Monday, at which point it sounds like a more specific case plan should come from DSS.   [name] is stable for discharge today and baby Cadence will stay in the newborn nursery. Per social worker Bernice, patient can call to get updates on baby and visit baby as long as she is supervised. Relayed this information to the patient, along with the fact that she will need to keep baby ID band and green visitor band in place to be able to visit baby. Patient verbalized understanding.  Genia Del, [place] 11/09/2019 11:39 AM

## 2019-11-09 NOTE — Discharge Instructions (Signed)

## 2019-11-09 NOTE — Discharge Planning (Addendum)
4/24:9:34 received call back from Lucretia Roers  I talked with her and they are requesting that baby stay in the hosptial until Monday. It sounds like something more official should happen then. Mom is okay to call and get updates on baby and visit, as long as visits are supervised.      Infant screen positive, Called CPS worker Jaydin Jalomo @ 8:48 am to notify. No answer, SW left message

## 2019-11-09 NOTE — Progress Notes (Addendum)
Reviewed D/C instructions with pt and family. Pt verbalized understanding of teaching. Discharged to home via W/C. Pt to schedule f/u appt. Green band given for visitation to [facility], process was explained to MOB.  Patient verbalized understanding. Birth certificate completed and signed. Baby's footprints in bassinet given at discharge.

## 2019-11-11 LAB — SURGICAL PATHOLOGY

## 2022-03-23 ENCOUNTER — Telehealth: Payer: Medicaid Other
